# Patient Record
Sex: Female | Born: 1985 | Hispanic: No | Marital: Married | Smoking: Never smoker
Health system: Southern US, Community
[De-identification: ages and names within clinical notes are randomized; demographics above are authoritative.]

## PROBLEM LIST (undated history)

## (undated) ENCOUNTER — Inpatient Hospital Stay (HOSPITAL_COMMUNITY): Payer: Self-pay

## (undated) DIAGNOSIS — Z789 Other specified health status: Secondary | ICD-10-CM

## (undated) HISTORY — PX: TONSILLECTOMY: SUR1361

---

## 2011-09-08 ENCOUNTER — Emergency Department (HOSPITAL_COMMUNITY)
Admission: EM | Admit: 2011-09-08 | Discharge: 2011-09-08 | Disposition: A | Discharge status: Home / Self Care | Payer: PRIVATE HEALTH INSURANCE | Attending: Emergency Medicine | Admitting: Emergency Medicine

## 2011-09-08 DIAGNOSIS — R002 Palpitations: Secondary | ICD-10-CM | POA: Insufficient documentation

## 2011-09-08 DIAGNOSIS — F411 Generalized anxiety disorder: Secondary | ICD-10-CM | POA: Insufficient documentation

## 2011-09-08 DIAGNOSIS — R0789 Other chest pain: Secondary | ICD-10-CM | POA: Insufficient documentation

## 2011-09-08 LAB — D-DIMER, QUANTITATIVE: D-Dimer, Quant: 0.42 ug/mL-FEU (ref 0.00–0.48)

## 2011-09-08 LAB — BASIC METABOLIC PANEL
CO2: 25 mEq/L (ref 19–32)
Calcium: 9.6 mg/dL (ref 8.4–10.5)
GFR calc non Af Amer: >90 mL/min (ref >90)
Sodium: 138 mEq/L (ref 135–145)

## 2011-09-08 LAB — CBC
MCH: 31 pg (ref 26.0–34.0)
Platelets: 239 10*3/uL (ref 150–400)
RBC: 4.51 MIL/uL (ref 3.87–5.11)

## 2012-03-13 ENCOUNTER — Ambulatory Visit (INDEPENDENT_AMBULATORY_CARE_PROVIDER_SITE_OTHER): Payer: PRIVATE HEALTH INSURANCE | Admitting: Family Medicine

## 2012-03-13 VITALS — BP 100/65 | HR 77 | Temp 98.3°F | Resp 14 | Ht 62.0 in | Wt 94.2 lb

## 2012-03-13 DIAGNOSIS — N926 Irregular menstruation, unspecified: Secondary | ICD-10-CM

## 2012-03-13 DIAGNOSIS — M549 Dorsalgia, unspecified: Secondary | ICD-10-CM

## 2012-03-13 LAB — POCT URINE PREGNANCY: Preg Test, Ur: NEGATIVE

## 2012-03-13 MED ORDER — NAPROXEN 500 MG PO TABS: 500.0000 mg | ORAL_TABLET | Freq: Two times a day (BID) | ORAL | Status: AC

## 2012-03-13 MED ORDER — NORETHIN ACE-ETH ESTRAD-FE 1-20 MG-MCG(24) PO TABS: 1.0000 | ORAL_TABLET | Freq: Every day | ORAL | Status: DC

## 2012-03-13 NOTE — Progress Notes (Signed)
  Subjective:    Patient ID: Dana Cantu, female    DOB: 06-21-1986, 26 y.o.   MRN: [id number]  HPI 26 yo female with amenorrhea, back and abdominal pain.  Smoker. Has occurred before - has never had regular menses.  For one week has had pain of period but no period.  Woke up this morning and left leg hurt and spasmed.  Goes down leg.  Making her limp a little.  Not married, denies any sexual activity ever.  Was told once that birth control pills could regulate her periods.  Would like to try them if indicated.     Review of Systems Negative except as per HPI     Objective:   Physical Exam  Constitutional: Vital signs are normal. She appears well-developed and well-nourished. She is active.  Cardiovascular: Normal rate, regular rhythm, normal heart sounds and normal pulses.   Pulmonary/Chest: Effort normal and breath sounds normal.  Abdominal: Soft. Normal appearance and bowel sounds are normal. She exhibits no distension and no mass. There is no hepatosplenomegaly. There is no tenderness. There is no rigidity, no rebound, no guarding, no CVA tenderness, no tenderness at McBurney's point and negative Murphy's sign. No hernia.  Neurological: She is alert.     Results for orders placed in visit on 03/13/12  POCT URINE PREGNANCY      Component Value Range   Preg Test, Ur Negative          Assessment & Plan:  Irregular menses, back pain - loestrin 24 FE and naprosyn.

## 2012-04-10 ENCOUNTER — Telehealth: Payer: Self-pay

## 2012-04-10 NOTE — Telephone Encounter (Signed)
Pt prescribed birth control by [facility] and has only one pill left, she would like for Dr to prescribe something a little less expensive for her, she cant afford the rx that was given.

## 2012-04-12 MED ORDER — NORETHIN ACE-ETH ESTRAD-FE 1-20 MG-MCG PO TABS: 1.0000 | ORAL_TABLET | Freq: Every day | ORAL | Status: AC

## 2012-04-12 NOTE — Telephone Encounter (Signed)
Please advise that the product I sent in is the same medication that Dr. Georgiana Shore rd'x, but in the older form.  It is generic.

## 2012-04-13 NOTE — Telephone Encounter (Signed)
NO ANSWER, UNABLE TO LEAVE MESSAGE 

## 2012-04-14 NOTE — Telephone Encounter (Signed)
NO ANSWER, UNABLE TO LEAVE MESSAGE 

## 2012-04-15 NOTE — Telephone Encounter (Signed)
No answer/no VM. Sent unable to reach letter w/ information from [place].

## 2015-01-25 ENCOUNTER — Ambulatory Visit (INDEPENDENT_AMBULATORY_CARE_PROVIDER_SITE_OTHER): Payer: Self-pay | Admitting: Family Medicine

## 2015-01-25 VITALS — BP 100/60 | HR 105 | Temp 102.9°F | Resp 16 | Ht 63.25 in | Wt 101.4 lb

## 2015-01-25 DIAGNOSIS — J029 Acute pharyngitis, unspecified: Secondary | ICD-10-CM

## 2015-01-25 DIAGNOSIS — R05 Cough: Secondary | ICD-10-CM

## 2015-01-25 DIAGNOSIS — R059 Cough, unspecified: Secondary | ICD-10-CM

## 2015-01-25 DIAGNOSIS — R509 Fever, unspecified: Secondary | ICD-10-CM

## 2015-01-25 LAB — POCT RAPID STREP A (OFFICE): Rapid Strep A Screen: NEGATIVE

## 2015-01-25 MED ORDER — AMOXICILLIN 875 MG PO TABS: 875.0000 mg | ORAL_TABLET | Freq: Two times a day (BID) | ORAL | Status: DC

## 2015-01-25 NOTE — Patient Instructions (Signed)
Drink plenty of fluids and get enough rest  Take Tylenol 500 mg 2 pills 3 times daily as needed for fever or aching. If that does not give sufficient relief can alternate with ibuprofen 600 mg 3 times daily  Take amoxicillin 875 mg twice daily. If we notify you that the throat culture comes back negative, you can discontinue it. Do not save left over antibiotics for self treatment.

## 2015-01-25 NOTE — Progress Notes (Signed)
Subjective: Patient has been sick today with a high fever, slight cough, very sore throat, slightly runny nose, generalized body aches. She has a history of a lot of throat infections in the past and had her tonsils taken out. She did not have a flu shot this year. 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 She is currently on her menstrual cycle  Objective: Ill-appearing young lady, febrile to touch. TMs normal. Throat erythematous without exudate. No tonsils. Neck supple small nodes. Chest clear to auscultation. Heart regular without murmurs. Abdomen soft and nontender. No rashes.  Assessment: Pharyngitis Slight cough Fever  Plan: Strep test  Results for orders placed or performed in visit on 01/25/15  POCT rapid strep A  Result Value Ref Range   Rapid Strep A Screen Negative Negative   I believe this still could be strep. We'll treat with antibiotics until the culture comes back. Drink plenty of fluids as she is a little dehydrated probably. Take Tylenol and/or ibuprofen. Return at anytime if worse.

## 2015-01-28 LAB — CULTURE, GROUP A STREP: Organism ID, Bacteria: NORMAL

## 2015-12-02 NOTE — L&D Delivery Note (Signed)
30 y.o. G1P0 at 940w6d delivered a viable female infant in cephalic, ROA position. No nuchal cord. Left anterior shoulder delivered with ease. 60 sec delayed cord clamping. Cord clamped x2 and cut. Placenta delivered spontaneously intact, with 3VC. Fundus firm on exam with massage and pitocin. Good hemostasis noted.  Laceration: 2nd degree perineal Suture: 3-0 Vicryl Good hemostasis noted.  Mom and baby recovering in LDR.    Apgars:  6/8 Weight:  3500g (7#11.5oz)    Jen MowElizabeth Mariena Meares, DO [facility] Fellow [facility] 08/09/2016, 9:33 AM

## 2015-12-03 ENCOUNTER — Ambulatory Visit (INDEPENDENT_AMBULATORY_CARE_PROVIDER_SITE_OTHER): Payer: Self-pay | Admitting: Emergency Medicine

## 2015-12-03 ENCOUNTER — Encounter (HOSPITAL_COMMUNITY): Payer: Self-pay | Admitting: *Deleted

## 2015-12-03 ENCOUNTER — Inpatient Hospital Stay (HOSPITAL_COMMUNITY): Payer: Medicaid Other

## 2015-12-03 ENCOUNTER — Telehealth: Payer: Self-pay | Admitting: Gynecology

## 2015-12-03 ENCOUNTER — Inpatient Hospital Stay (HOSPITAL_COMMUNITY)
Admission: AD | Admit: 2015-12-03 | Discharge: 2015-12-03 | Disposition: A | Discharge status: Home / Self Care | Payer: Medicaid Other | Source: Ambulatory Visit | Attending: Family Medicine | Admitting: Family Medicine

## 2015-12-03 VITALS — BP 108/72 | HR 87 | Temp 98.0°F | Resp 18 | Ht 62.5 in | Wt 98.0 lb

## 2015-12-03 DIAGNOSIS — O9989 Other specified diseases and conditions complicating pregnancy, childbirth and the puerperium: Secondary | ICD-10-CM | POA: Insufficient documentation

## 2015-12-03 DIAGNOSIS — O0000 Abdominal pregnancy without intrauterine pregnancy: Secondary | ICD-10-CM

## 2015-12-03 DIAGNOSIS — Z87891 Personal history of nicotine dependence: Secondary | ICD-10-CM | POA: Diagnosis not present

## 2015-12-03 DIAGNOSIS — R1030 Lower abdominal pain, unspecified: Secondary | ICD-10-CM

## 2015-12-03 DIAGNOSIS — O3680X0 Pregnancy with inconclusive fetal viability, not applicable or unspecified: Secondary | ICD-10-CM

## 2015-12-03 DIAGNOSIS — R102 Pelvic and perineal pain: Secondary | ICD-10-CM | POA: Diagnosis not present

## 2015-12-03 DIAGNOSIS — Z3A01 Less than 8 weeks gestation of pregnancy: Secondary | ICD-10-CM | POA: Diagnosis not present

## 2015-12-03 LAB — POCT CBC
Granulocyte percent: 60.6 %G (ref 37–80)
HCT, POC: 40.3 % (ref 37.7–47.9)
HEMOGLOBIN: 14.1 g/dL (ref 12.2–16.2)
LYMPH, POC: 1.6 (ref 0.6–3.4)
MCH, POC: 30.5 pg (ref 27–31.2)
MCHC: 34.9 g/dL (ref 31.8–35.4)
MCV: 87.4 fL (ref 80–97)
MID (cbc): 0.2 (ref 0–0.9)
MPV: 7.5 fL (ref 0–99.8)
PLATELET COUNT, POC: 219 10*3/uL (ref 142–424)
POC Granulocyte: 2.7 (ref 2–6.9)
POC LYMPH %: 34.6 % (ref 10–50)
POC MID %: 4.87 %M (ref 0–12)
RBC: 4.61 M/uL (ref 4.04–5.48)
RDW, POC: 14.1 %
WBC: 4.5 10*3/uL — AB (ref 4.6–10.2)

## 2015-12-03 LAB — POCT URINALYSIS DIP (MANUAL ENTRY)
BILIRUBIN UA: NEGATIVE
Bilirubin, UA: NEGATIVE
Glucose, UA: NEGATIVE
NITRITE UA: NEGATIVE
PH UA: 7
PROTEIN UA: NEGATIVE
Spec Grav, UA: 1.015
UROBILINOGEN UA: 0.2

## 2015-12-03 LAB — HCG, QUANTITATIVE, PREGNANCY
HCG, BETA CHAIN, QUANT, S: 16715.1 m[IU]/mL — AB
hCG, Beta Chain, Quant, S: 21067 m[IU]/mL — ABNORMAL HIGH (ref <5)

## 2015-12-03 LAB — POCT WET + KOH PREP: TRICH BY WET PREP: ABSENT

## 2015-12-03 LAB — POC MICROSCOPIC URINALYSIS (UMFC): Mucus: ABSENT

## 2015-12-03 LAB — POCT URINE PREGNANCY: Preg Test, Ur: POSITIVE — AB

## 2015-12-03 MED ORDER — PRENATAL 28-0.8 MG PO TABS: 1.0000 | ORAL_TABLET | Freq: Every day | ORAL | Status: DC

## 2015-12-03 NOTE — Telephone Encounter (Signed)
Reported BHCG to Dr. Adrian BlackwaterStinson.  Has appt for repeat on Wednesday, Jan 4 in the Clinic.  Strict ectopic precautions were given to patient.

## 2015-12-03 NOTE — Patient Instructions (Signed)

## 2015-12-03 NOTE — Progress Notes (Signed)
Subjective:  Patient ID: Dana Cantu, female    DOB: [birth year]  Age: 30 y.o. MRN: [id number]  CC: Abdominal Pain and Fatigue   HPI Dana Cantu presents  patient is nulligravida who missed her last period. She did a home pregnancy test was positive. She since learning she was pregnant she been nauseated. The nausea is most prominent in the morning and evening. She has no vomiting. She now has suprapubic discomfort and pain. She denies any dysuria urgency or frequency. No stool change. She has no fever or chills. Has no vaginal bleeding or discharge.  History Dana Cantu has no past medical history on file.   She has past surgical history that includes Tonsillectomy.   Her  family history is not on file.  She   reports that she has quit smoking. Her smoking use included Cigarettes. She does not have any smokeless tobacco history on file. She reports that she does not drink alcohol or use illicit drugs.  Outpatient Prescriptions Prior to Visit  Medication Sig Dispense Refill  . amoxicillin (AMOXIL) 875 MG tablet Take 1 tablet (875 mg total) by mouth 2 (two) times daily. (Patient not taking: Reported on 12/03/2015) 20 tablet 0   No facility-administered medications prior to visit.    Social History   Social History  . Marital Status: Single    Spouse Name: N/A  . Number of Children: N/A  . Years of Education: N/A   Social History Main Topics  . Smoking status: Former Smoker    Types: Cigarettes  . Smokeless tobacco: None  . Alcohol Use: No  . Drug Use: No  . Sexual Activity: Not Asked   Other Topics Concern  . None   Social History Narrative     Review of Systems  Constitutional: Negative for fever, chills and appetite change.  HENT: Negative for congestion, ear pain, postnasal drip, sinus pressure and sore throat.   Eyes: Negative for pain and redness.  Respiratory: Negative for cough, shortness of breath and wheezing.   Cardiovascular: Negative for leg swelling.    Gastrointestinal: Positive for nausea, vomiting and abdominal pain. Negative for diarrhea, constipation and blood in stool.  Endocrine: Negative for polyuria.  Genitourinary: Positive for pelvic pain. Negative for dysuria, urgency, frequency and flank pain.  Musculoskeletal: Negative for gait problem.  Skin: Negative for rash.  Neurological: Negative for weakness and headaches.  Psychiatric/Behavioral: Negative for confusion and decreased concentration. The patient is not nervous/anxious.     Objective:  BP 108/72 mmHg  Pulse 87  Temp(Src) 98 F (36.7 C) (Oral)  Resp 18  Ht 5' 2.5" (1.588 m)  Wt 98 lb (44.453 kg)  BMI 17.63 kg/m2  SpO2 98%  LMP 10/27/2015  Physical Exam  Constitutional: She is oriented to person, place, and time. She appears well-developed and well-nourished. No distress.  HENT:  Head: Normocephalic and atraumatic.  Right Ear: External ear normal.  Left Ear: External ear normal.  Nose: Nose normal.  Eyes: Conjunctivae and EOM are normal. Pupils are equal, round, and reactive to light. No scleral icterus.  Neck: Normal range of motion. Neck supple. No tracheal deviation present.  Cardiovascular: Normal rate, regular rhythm and normal heart sounds.   Pulmonary/Chest: Effort normal. No respiratory distress. She has no wheezes. She has no rales.  Abdominal: She exhibits no mass. There is tenderness in the suprapubic area. There is no rebound and no guarding.  Musculoskeletal: She exhibits no edema.  Lymphadenopathy:    She has no  cervical adenopathy.  Neurological: She is alert and oriented to person, place, and time. Coordination normal.  Skin: Skin is warm and dry. No rash noted.  Psychiatric: She has a normal mood and affect. Her behavior is normal.      Assessment & Plan:   Grant was seen today for abdominal pain and fatigue.  Diagnoses and all orders for this visit:  Lower abdominal pain -     POCT CBC -     POCT urinalysis dipstick -     POCT  Microscopic Urinalysis ([facility]) -     POCT urine pregnancy -     hCG, quantitative, pregnancy -     US Pelvis Complete; Future -     POCT Wet + KOH Prep  Abdominal pregnancy, unspecified whether intrauterine pregnancy present -     US Pelvis Complete; Future -     POCT Wet + KOH Prep -     Ambulatory referral to Obstetrics / Gynecology  Other orders -     PRENATAL 28-0.8 MG TABS; Take 1 tablet by mouth daily.   I am having Dana Cantu start on PRENATAL. I am also having her maintain her amoxicillin.  Meds ordered this encounter  Medications  . PRENATAL 28-0.8 MG TABS    Sig: Take 1 tablet by mouth daily.    Dispense:  30 each    Refill:  12   She was referred to the women's ER as we could not obtain a vaginal ultrasound today due to the holiday.  Appropriate red flag conditions were discussed with the patient as well as actions that should be taken.  Patient expressed his understanding.  Follow-up: Return if symptoms worsen or fail to improve.  Carmelina Dane, MD  Results for orders placed or performed in visit on 12/03/15  POCT CBC  Result Value Ref Range   WBC 4.5 (A) 4.6 - 10.2 K/uL   Lymph, poc 1.6 0.6 - 3.4   POC LYMPH PERCENT 34.6 10 - 50 %L   MID (cbc) 0.2 0 - 0.9   POC MID % 4.87 0 - 12 %M   POC Granulocyte 2.7 2 - 6.9   Granulocyte percent 60.6 37 - 80 %G   RBC 4.61 4.04 - 5.48 M/uL   Hemoglobin 14.1 12.2 - 16.2 g/dL   HCT, POC 16.1 09.6 - 47.9 %   MCV 87.4 80 - 97 fL   MCH, POC 30.5 27 - 31.2 pg   MCHC 34.9 31.8 - 35.4 g/dL   RDW, POC 04.5 %   Platelet Count, POC 219 142 - 424 K/uL   MPV 7.5 0 - 99.8 fL  POCT urinalysis dipstick  Result Value Ref Range   Color, UA yellow yellow   Clarity, UA cloudy (A) clear   Glucose, UA negative negative   Bilirubin, UA negative negative   Ketones, POC UA negative negative   Spec Grav, UA 1.015    Blood, UA trace-intact (A) negative   pH, UA 7.0    Protein Ur, POC negative negative   Urobilinogen, UA 0.2     Nitrite, UA Negative Negative   Leukocytes, UA moderate (2+) (A) Negative  POCT Microscopic Urinalysis ([facility])  Result Value Ref Range   WBC,UR,HPF,POC Few (A) None WBC/hpf   RBC,UR,HPF,POC None None RBC/hpf   Bacteria Few (A) None, Too numerous to count   Mucus Absent Absent   Epithelial Cells, UR Per Microscopy Few (A) None, Too numerous to count cells/hpf  POCT urine  pregnancy  Result Value Ref Range   Preg Test, Ur Positive (A) Negative  POCT Wet + KOH Prep  Result Value Ref Range   Yeast by KOH Present Present, Absent   Yeast by wet prep Present Present, Absent   WBC by wet prep Moderate (A) None, Few, Too numerous to count   Clue Cells Wet Prep HPF POC Few (A) None, Too numerous to count   Trich by wet prep Absent Present, Absent   Bacteria Wet Prep HPF POC Moderate (A) None, Few, Too numerous to count   Epithelial Cells By [employer] Pref ([facility]) Many (A) None, Few, Too numerous to count   RBC,UR,HPF,POC None None RBC/hpf

## 2015-12-03 NOTE — Progress Notes (Signed)
GYN Exam: Normal female external genitalia. Pubic hair is shaved. No inguinal or femoral lymphadenopathy. Moderate to large thick white discharge in the vaginal vault. Moderate amount of brown discharge in the vault. Cervix with closed os. No CMT. No RIGHT adnexal tenderness or fullness. Moderate LEFT adnexal tenderness. No fullness.

## 2015-12-03 NOTE — Discharge Instructions (Signed)

## 2015-12-03 NOTE — MAU Provider Note (Signed)
History     CSN: [id number]  Arrival date and time: 12/03/15 1243   None     Chief Complaint  Patient presents with  . Abdominal Pain   HPI Dana Cantu is 30 y.o. G1P0 [redacted]w[redacted]d weeks presenting after evaluation at Urgent Care for pelvic pain in early pregnancy.  Labs were drawn there.  Here per patient to rule out pregnancy in the wrong place.  She is asking no other labs be drawn unless necessary to reduce her cost.  Explained labs usually done in Ectopic workup--CBC and BHCG drawn at Urgent Care.  She is not having any vaginal bleeding, ABO RH discontinued.  She understands she will need repeat BHCG in 48 hrs.     No past medical history on file.  Past Surgical History  Procedure Laterality Date  . Tonsillectomy      No family history on file.  Social History  Substance Use Topics  . Smoking status: Former Smoker    Types: Cigarettes  . Smokeless tobacco: Not on file  . Alcohol Use: No    Allergies: No Known Allergies  Prescriptions prior to admission  Medication Sig Dispense Refill Last Dose  . amoxicillin (AMOXIL) 875 MG tablet Take 1 tablet (875 mg total) by mouth 2 (two) times daily. (Patient not taking: Reported on 12/03/2015) 20 tablet 0 Not Taking  . PRENATAL 28-0.8 MG TABS Take 1 tablet by mouth daily. 30 each 12     Review of Systems  Constitutional: Negative for fever and chills.  Gastrointestinal: Positive for abdominal pain (lower pelvic pain). Negative for nausea and vomiting.  Genitourinary: Negative for dysuria, urgency and frequency.       Neg for vaginal bleeding.  Neurological: Negative for headaches.   Physical Exam   Blood pressure 104/66, pulse 79, temperature 97.9 F (36.6 C), temperature source Oral, resp. rate 16, height 5' 2.5" (1.588 m), last menstrual period 10/27/2015.  Physical Exam  Constitutional: She is oriented to person, place, and time. She appears well-developed and well-nourished. No distress.  HENT:  Head: Normocephalic.   Cardiovascular: Normal rate.   Respiratory: Effort normal.  Genitourinary:  Pelvic not repeated.  Done at Urgent Care earlier today.  Neg for increasing sxs.   Neurological: She is alert and oriented to person, place, and time.  Skin: Skin is warm and dry.  Psychiatric: She has a normal mood and affect. Her behavior is normal.   LAB RESULTS FROM URGENT CARE:  Results for orders placed or performed in visit on 12/03/15 (from the past 24 hour(s))  hCG, quantitative, pregnancy     Status: Abnormal   Collection Time: 12/03/15 10:55 AM  Result Value Ref Range   hCG, Beta Chain, Quant, S 16715.1 (H) mIU/mL   Narrative   Performed at:  [facility]                [address]                [place], [place] [address]  POCT CBC     Status: Abnormal   Collection Time: 12/03/15 11:06 AM  Result Value Ref Range   WBC 4.5 (A) 4.6 - 10.2 K/uL   Lymph, poc 1.6 0.6 - 3.4   POC LYMPH PERCENT 34.6 10 - 50 %L   MID (cbc) 0.2 0 - 0.9   POC MID % 4.87 0 - 12 %M   POC Granulocyte 2.7 2 - 6.9   Granulocyte percent 60.6 37 -  80 %G   RBC 4.61 4.04 - 5.48 M/uL   Hemoglobin 14.1 12.2 - 16.2 g/dL   HCT, POC 16.1 09.6 - 47.9 %   MCV 87.4 80 - 97 fL   MCH, POC 30.5 27 - 31.2 pg   MCHC 34.9 31.8 - 35.4 g/dL   RDW, POC 04.5 %   Platelet Count, POC 219 142 - 424 K/uL   MPV 7.5 0 - 99.8 fL  POCT urinalysis dipstick     Status: Abnormal   Collection Time: 12/03/15 11:06 AM  Result Value Ref Range   Color, UA yellow yellow   Clarity, UA cloudy (A) clear   Glucose, UA negative negative   Bilirubin, UA negative negative   Ketones, POC UA negative negative   Spec Grav, UA 1.015    Blood, UA trace-intact (A) negative   pH, UA 7.0    Protein Ur, POC negative negative   Urobilinogen, UA 0.2    Nitrite, UA Negative Negative   Leukocytes, UA moderate (2+) (A) Negative  POCT Microscopic Urinalysis ([facility])     Status: Abnormal   Collection Time: 12/03/15 11:14 AM  Result Value Ref Range    WBC,UR,HPF,POC Few (A) None WBC/hpf   RBC,UR,HPF,POC None None RBC/hpf   Bacteria Few (A) None, Too numerous to count   Mucus Absent Absent   Epithelial Cells, UR Per Microscopy Few (A) None, Too numerous to count cells/hpf  POCT urine pregnancy     Status: Abnormal   Collection Time: 12/03/15 11:14 AM  Result Value Ref Range   Preg Test, Ur Positive (A) Negative  POCT Wet + KOH Prep     Status: Abnormal   Collection Time: 12/03/15  1:15 PM  Result Value Ref Range   Yeast by KOH Present Present, Absent   Yeast by wet prep Present Present, Absent   WBC by wet prep Moderate (A) None, Few, Too numerous to count   Clue Cells Wet Prep HPF POC Few (A) None, Too numerous to count   Trich by wet prep Absent Present, Absent   Bacteria Wet Prep HPF POC Moderate (A) None, Few, Too numerous to count   Epithelial Cells By [employer] ([facility]) Many (A) None, Few, Too numerous to count   RBC,UR,HPF,POC None None RBC/hpf    ULTRASOUND DONE AT [facility]  US Ob Comp Less 14 Wks  12/03/2015  CLINICAL DATA:  Patient with pelvic pain.  Positive pregnancy test. EXAM: OBSTETRIC <14 WK [place] AND TRANSVAGINAL OB US TECHNIQUE: Both transabdominal and transvaginal ultrasound examinations were performed for complete evaluation of the gestation as well as the maternal uterus, adnexal regions, and pelvic cul-de-sac. Transvaginal technique was performed to assess early pregnancy. COMPARISON:  None. FINDINGS: Intrauterine gestational sac: Visualized/normal in shape. Yolk sac:  Not present Embryo:  Not present Cardiac Activity: Not present MSD: 14  mm   6 w   2  d Subchorionic hemorrhage:  None visualized. Maternal uterus/adnexae: Corpus luteum within the left ovary. Normal right ovary. Small amount of free fluid in the pelvis. IMPRESSION: Probable early intrauterine gestational sac, but no yolk sac, fetal pole, or cardiac activity yet visualized. Recommend follow-up quantitative B-HCG levels and follow-up US in 14 days to confirm  and assess viability. This recommendation follows SRU consensus guidelines: Diagnostic Criteria for Nonviable Pregnancy Early in the First Trimester. Malva Limes Med 2013; 409:8119-14. Electronically Signed   By: Annia Belt M.D.   On: 12/03/2015 16:12   US Ob Transvaginal  12/03/2015  CLINICAL DATA:  Patient with pelvic pain.  Positive pregnancy test. EXAM: OBSTETRIC <14 WK US AND TRANSVAGINAL OB US TECHNIQUE: Both transabdominal and transvaginal ultrasound examinations were performed for complete evaluation of the gestation as well as the maternal uterus, adnexal regions, and pelvic cul-de-sac. Transvaginal technique was performed to assess early pregnancy. COMPARISON:  None. FINDINGS: Intrauterine gestational sac: Visualized/normal in shape. Yolk sac:  Not present Embryo:  Not present Cardiac Activity: Not present MSD: 14  mm   6 w   2  d Subchorionic hemorrhage:  None visualized. Maternal uterus/adnexae: Corpus luteum within the left ovary. Normal right ovary. Small amount of free fluid in the pelvis. IMPRESSION: Probable early intrauterine gestational sac, but no yolk sac, fetal pole, or cardiac activity yet visualized. Recommend follow-up quantitative B-HCG levels and follow-up US in 14 days to confirm and assess viability. This recommendation follows SRU consensus guidelines: Diagnostic Criteria for Nonviable Pregnancy Early in the First Trimester. Malva Limes Engl J Med 2013; 409:8119-14; 369:1443-51. Electronically Signed   By: Annia Beltrew  Davis M.D.   On: 12/03/2015 16:12   MAU Course  Procedures   Repeat BHCG drawn   MDM MSE U/S Discussed lab findings and US with Dr. Adrian BlackwaterStinson.  With BHCG results at another facility being so high without correlating U/S finding, Dr. Adrian BlackwaterStinson requested repeat BHCG and repeat in 48 hrs.   Assessment and Plan  A:  Pelvic pain in early pregnancy      U/S shows 5721w2d Probable early IUGS without YS, FP or CA  P: Discussed strict ectopic precautions, return for increased pain or vaginal bleeding     Return in 48 hrs for repeat BHCG in [facility]--appt requested in Yellow book.  KEY,EVE M 12/03/2015, 4:30 PM

## 2015-12-03 NOTE — MAU Note (Signed)
Pt seen @ UC today, pos UPT, abd pain - sent to MAU.  Lower abd pain for the last 3 weeks, intermittent.  Denies bleeding.

## 2015-12-04 ENCOUNTER — Telehealth: Payer: Self-pay

## 2015-12-04 NOTE — Telephone Encounter (Signed)
Per Dr. Dareen PianoAnderson she does not have an infection and she needs to follow up with gyn tomorrow.  Advised her to let them know about the pain she is having.

## 2015-12-04 NOTE — Telephone Encounter (Signed)
Pt states Dr Dareen PianoAnderson was suppose to give her an antibiotic of AMOXIL but he didn't. Please call [phone]     [facility] [address]

## 2015-12-05 ENCOUNTER — Encounter: Payer: Self-pay | Admitting: Obstetrics and Gynecology

## 2015-12-05 ENCOUNTER — Ambulatory Visit (INDEPENDENT_AMBULATORY_CARE_PROVIDER_SITE_OTHER): Payer: Self-pay | Admitting: Obstetrics and Gynecology

## 2015-12-05 DIAGNOSIS — O3680X0 Pregnancy with inconclusive fetal viability, not applicable or unspecified: Secondary | ICD-10-CM

## 2015-12-05 DIAGNOSIS — Z331 Pregnant state, incidental: Secondary | ICD-10-CM

## 2015-12-05 LAB — HCG, QUANTITATIVE, PREGNANCY: hCG, Beta Chain, Quant, S: 31908 m[IU]/mL — ABNORMAL HIGH (ref <5)

## 2015-12-05 NOTE — Progress Notes (Signed)
F/u OB US scheduled for January 13th @ 0900.  Pt notified.

## 2015-12-05 NOTE — Progress Notes (Signed)
Patient ID: Dana Cantu, female   DOB: 27-Sep-1986, 30 y.o.   MRN: [id number] Lab Results Note  SUBJECTIVE HPI:  Dana Cantu is a 30 y.o. G1P0 at [redacted]w[redacted]d by LMP who presents to the [facility] for followupquant HCG. The patient denies abdominal pain or vaginal bleeding.  Upon review of the patient's records, patient was first seen in [facility] on 12/03/2015 for pelvic pain in early pregnacy.   BHCG on that day was 78295.  Ultrasound showed an intrauterine gestational sac but no fetal pole or yolk sac.  Repeat quant HCG was performed earlier today.   History reviewed. No pertinent past medical history. Past Surgical History  Procedure Laterality Date  . Tonsillectomy     Social History   Social History  . Marital Status: Married    Spouse Name: N/A  . Number of Children: N/A  . Years of Education: N/A   Occupational History  . Not on file.   Social History Main Topics  . Smoking status: Former Smoker    Types: Cigarettes  . Smokeless tobacco: Not on file  . Alcohol Use: No  . Drug Use: No  . Sexual Activity: Not on file   Other Topics Concern  . Not on file   Social History Narrative   Current Outpatient Prescriptions on File Prior to Visit  Medication Sig Dispense Refill  . PRENATAL 28-0.8 MG TABS Take 1 tablet by mouth daily. 30 each 12   No current facility-administered medications on file prior to visit.   No Known Allergies  I have reviewed patient's Past Medical Hx, Surgical Hx, Family Hx, Social Hx, medications and allergies.   Review of Systems Review of Systems  Constitutional: Negative for fever and chills.  Gastrointestinal: Negative for nausea, vomiting, abdominal pain, diarrhea and constipation.  Genitourinary: Negative for dysuria.  Musculoskeletal: Negative for back pain.  Neurological: Negative for dizziness and weakness.    Physical Exam  LMP 10/27/2015  GENERAL: Well-developed, well-nourished female in no acute distress.  HEENT:  Normocephalic, atraumatic.   LUNGS: Effort normal ABDOMEN: soft, non-tender HEART: Regular rate  SKIN: Warm, dry and without erythema PSYCH: Normal mood and affect NEURO: Alert and oriented x 4  LAB RESULTS No results found for this or any previous visit (from the past 24 hour(s)).  IMAGING US Ob Comp Less 14 Wks  12/03/2015  CLINICAL DATA:  Patient with pelvic pain.  Positive pregnancy test. EXAM: OBSTETRIC <14 WK [place] AND TRANSVAGINAL OB US TECHNIQUE: Both transabdominal and transvaginal ultrasound examinations were performed for complete evaluation of the gestation as well as the maternal uterus, adnexal regions, and pelvic cul-de-sac. Transvaginal technique was performed to assess early pregnancy. COMPARISON:  None. FINDINGS: Intrauterine gestational sac: Visualized/normal in shape. Yolk sac:  Not present Embryo:  Not present Cardiac Activity: Not present MSD: 14  mm   6 w   2  d Subchorionic hemorrhage:  None visualized. Maternal uterus/adnexae: Corpus luteum within the left ovary. Normal right ovary. Small amount of free fluid in the pelvis. IMPRESSION: Probable early intrauterine gestational sac, but no yolk sac, fetal pole, or cardiac activity yet visualized. Recommend follow-up quantitative B-HCG levels and follow-up US in 14 days to confirm and assess viability. This recommendation follows SRU consensus guidelines: Diagnostic Criteria for Nonviable Pregnancy Early in the First Trimester. Malva Limes Med 2013; 621:3086-57. Electronically Signed   By: Annia Belt M.D.   On: 12/03/2015 16:12   US Ob Transvaginal  12/03/2015  CLINICAL  DATA:  Patient with pelvic pain.  Positive pregnancy test. EXAM: OBSTETRIC <14 WK US AND TRANSVAGINAL OB US TECHNIQUE: Both transabdominal and transvaginal ultrasound examinations were performed for complete evaluation of the gestation as well as the maternal uterus, adnexal regions, and pelvic cul-de-sac. Transvaginal technique was performed to assess early pregnancy.  COMPARISON:  None. FINDINGS: Intrauterine gestational sac: Visualized/normal in shape. Yolk sac:  Not present Embryo:  Not present Cardiac Activity: Not present MSD: 14  mm   6 w   2  d Subchorionic hemorrhage:  None visualized. Maternal uterus/adnexae: Corpus luteum within the left ovary. Normal right ovary. Small amount of free fluid in the pelvis. IMPRESSION: Probable early intrauterine gestational sac, but no yolk sac, fetal pole, or cardiac activity yet visualized. Recommend follow-up quantitative B-HCG levels and follow-up US in 14 days to confirm and assess viability. This recommendation follows SRU consensus guidelines: Diagnostic Criteria for Nonviable Pregnancy Early in the First Trimester. Malva Limes Engl J Med 2013; 308:6578-46; 369:1443-51. Electronically Signed   By: Annia Beltrew  Davis M.D.   On: 12/03/2015 16:12    ASSESSMENT 1. Pregnancy of unknown anatomic location     PLAN Discharge home in stable condition. Ectopic precautions reviewed Patient advised to start/continue taking prenatal vitamins Quant HCG [id number] today (rise from 21068 2 days ago) Will schedule viability ultrasound in week of 12/17/2015 [facility] to [facility]  Dana AntiguaPeggy Netanel Yannuzzi, MD  12/05/2015  3:03 PM

## 2015-12-14 ENCOUNTER — Ambulatory Visit (HOSPITAL_COMMUNITY)
Admission: RE | Admit: 2015-12-14 | Discharge: 2015-12-14 | Disposition: A | Discharge status: Home / Self Care | Payer: Medicaid Other | Source: Ambulatory Visit | Attending: Obstetrics and Gynecology | Admitting: Obstetrics and Gynecology

## 2015-12-14 ENCOUNTER — Ambulatory Visit (INDEPENDENT_AMBULATORY_CARE_PROVIDER_SITE_OTHER): Payer: Self-pay | Admitting: Obstetrics and Gynecology

## 2015-12-14 ENCOUNTER — Encounter: Payer: Self-pay | Admitting: Obstetrics and Gynecology

## 2015-12-14 DIAGNOSIS — Z3490 Encounter for supervision of normal pregnancy, unspecified, unspecified trimester: Secondary | ICD-10-CM

## 2015-12-14 DIAGNOSIS — Z36 Encounter for antenatal screening of mother: Secondary | ICD-10-CM | POA: Diagnosis present

## 2015-12-14 DIAGNOSIS — O3680X0 Pregnancy with inconclusive fetal viability, not applicable or unspecified: Secondary | ICD-10-CM

## 2015-12-14 DIAGNOSIS — Z3201 Encounter for pregnancy test, result positive: Secondary | ICD-10-CM

## 2015-12-14 NOTE — Progress Notes (Signed)
Patient ID: Dana Cantu, female   DOB: 22-Feb-1986, 30 y.o.   MRN: [id number] Ultrasounds Results Note  SUBJECTIVE HPI:  Dana Cantu is a 29 y.o. G1P0 at [redacted]w[redacted]d by LMP who presents to the [facility] for followup ultrasound results. The patient denies abdominal pain or vaginal bleeding.  Patient had an ultrasound on 12/03/2015 showing a IUGS without fetal pole. Her quant rose to 30000 on 1/4. Patient had her follow-up ultrasound today  History reviewed. No pertinent past medical history. Past Surgical History  Procedure Laterality Date  . Tonsillectomy     Social History   Social History  . Marital Status: Married    Spouse Name: N/A  . Number of Children: N/A  . Years of Education: N/A   Occupational History  . Not on file.   Social History Main Topics  . Smoking status: Former Smoker    Types: Cigarettes  . Smokeless tobacco: Not on file  . Alcohol Use: No  . Drug Use: No  . Sexual Activity: Not on file   Other Topics Concern  . Not on file   Social History Narrative   Current Outpatient Prescriptions on File Prior to Visit  Medication Sig Dispense Refill  . PRENATAL 28-0.8 MG TABS Take 1 tablet by mouth daily. 30 each 12   No current facility-administered medications on file prior to visit.   No Known Allergies  I have reviewed patient's Past Medical Hx, Surgical Hx, Family Hx, Social Hx, medications and allergies.   Review of Systems Review of Systems  Constitutional: Negative for fever and chills.  Gastrointestinal: Negative for nausea, vomiting, abdominal pain, diarrhea and constipation.  Genitourinary: Negative for dysuria.  Musculoskeletal: Negative for back pain.  Neurological: Negative for dizziness and weakness.    Physical Exam  LMP 10/27/2015  GENERAL: Well-developed, well-nourished female in no acute distress.  HEENT: Normocephalic, atraumatic.   LUNGS: Effort normal ABDOMEN: soft, non-tender HEART: Regular rate  SKIN: Warm, dry  and without erythema PSYCH: Normal mood and affect NEURO: Alert and oriented x 4  LAB RESULTS No results found for this or any previous visit (from the past 24 hour(s)).  IMAGING US Ob Comp Less 14 Wks  12/03/2015  CLINICAL DATA:  Patient with pelvic pain.  Positive pregnancy test. EXAM: OBSTETRIC <14 WK [place] AND TRANSVAGINAL OB US TECHNIQUE: Both transabdominal and transvaginal ultrasound examinations were performed for complete evaluation of the gestation as well as the maternal uterus, adnexal regions, and pelvic cul-de-sac. Transvaginal technique was performed to assess early pregnancy. COMPARISON:  None. FINDINGS: Intrauterine gestational sac: Visualized/normal in shape. Yolk sac:  Not present Embryo:  Not present Cardiac Activity: Not present MSD: 14  mm   6 w   2  d Subchorionic hemorrhage:  None visualized. Maternal uterus/adnexae: Corpus luteum within the left ovary. Normal right ovary. Small amount of free fluid in the pelvis. IMPRESSION: Probable early intrauterine gestational sac, but no yolk sac, fetal pole, or cardiac activity yet visualized. Recommend follow-up quantitative B-HCG levels and follow-up US in 14 days to confirm and assess viability. This recommendation follows SRU consensus guidelines: Diagnostic Criteria for Nonviable Pregnancy Early in the First Trimester. Malva Limes Med 2013; 782:9562-13. Electronically Signed   By: Annia Belt M.D.   On: 12/03/2015 16:12   US Ob Transvaginal  12/14/2015  CLINICAL DATA:  Followup viability. LMP 10/27/2015. By LMP patient is 6 weeks 6 days. EDC by LMP is 08/02/2016. EXAM: TRANSVAGINAL OB ULTRASOUND TECHNIQUE: Transvaginal  ultrasound was performed for complete evaluation of the gestation as well as the maternal uterus, adnexal regions, and pelvic cul-de-sac. COMPARISON:  12/03/2015 FINDINGS: Intrauterine gestational sac: Visualized/normal in shape. Yolk sac:  Present Embryo:  Present Cardiac Activity: Present Heart Rate: Present bpm CRL:   8.1   mm   6 w 5 d                  US EDC: 08/03/2016 Subchorionic hemorrhage:  None visualized. Maternal uterus/adnexae: Ovaries have a normal appearance. Left corpus luteum cyst is present. Trace free pelvic fluid is noted. IMPRESSION: 1. Single living intrauterine embryo. 2. Size and dates correlate well. Today's exam confirms clinical EDC of 08/02/2016. Electronically Signed   By: Norva PavlovElizabeth  Brown M.D.   On: 12/14/2015 09:56   [place] Ob Transvaginal  12/03/2015  CLINICAL DATA:  Patient with pelvic pain.  Positive pregnancy test. EXAM: OBSTETRIC <14 WK US AND TRANSVAGINAL OB US TECHNIQUE: Both transabdominal and transvaginal ultrasound examinations were performed for complete evaluation of the gestation as well as the maternal uterus, adnexal regions, and pelvic cul-de-sac. Transvaginal technique was performed to assess early pregnancy. COMPARISON:  None. FINDINGS: Intrauterine gestational sac: Visualized/normal in shape. Yolk sac:  Not present Embryo:  Not present Cardiac Activity: Not present MSD: 14  mm   6 w   2  d Subchorionic hemorrhage:  None visualized. Maternal uterus/adnexae: Corpus luteum within the left ovary. Normal right ovary. Small amount of free fluid in the pelvis. IMPRESSION: Probable early intrauterine gestational sac, but no yolk sac, fetal pole, or cardiac activity yet visualized. Recommend follow-up quantitative B-HCG levels and follow-up US in 14 days to confirm and assess viability. This recommendation follows SRU consensus guidelines: Diagnostic Criteria for Nonviable Pregnancy Early in the First Trimester. Malva Limes Engl J Med 2013; 161:0960-45; 369:1443-51. Electronically Signed   By: Annia Beltrew  Davis M.D.   On: 12/03/2015 16:12    ASSESSMENT 1. Pregnancy     PLAN Discharge home in stable condition Patient advised to start taking prenatal vitamins Pregnancy confirmation letter given Patient [facility]  Catalina AntiguaPeggy Josselin Gaulin, MD  12/14/2015   10:00 AM

## 2016-01-09 ENCOUNTER — Ambulatory Visit (INDEPENDENT_AMBULATORY_CARE_PROVIDER_SITE_OTHER): Payer: Self-pay | Admitting: Family

## 2016-01-09 ENCOUNTER — Encounter: Payer: Self-pay | Admitting: Family

## 2016-01-09 VITALS — BP 116/79 | HR 90 | Temp 98.3°F | Wt 100.5 lb

## 2016-01-09 DIAGNOSIS — Z3481 Encounter for supervision of other normal pregnancy, first trimester: Secondary | ICD-10-CM

## 2016-01-09 DIAGNOSIS — Z124 Encounter for screening for malignant neoplasm of cervix: Secondary | ICD-10-CM | POA: Diagnosis not present

## 2016-01-09 DIAGNOSIS — N898 Other specified noninflammatory disorders of vagina: Secondary | ICD-10-CM

## 2016-01-09 DIAGNOSIS — O26891 Other specified pregnancy related conditions, first trimester: Secondary | ICD-10-CM

## 2016-01-09 DIAGNOSIS — Z3490 Encounter for supervision of normal pregnancy, unspecified, unspecified trimester: Secondary | ICD-10-CM | POA: Insufficient documentation

## 2016-01-09 LAB — POCT URINALYSIS DIP (DEVICE)
BILIRUBIN URINE: NEGATIVE
Glucose, UA: NEGATIVE mg/dL
KETONES UR: NEGATIVE mg/dL
NITRITE: NEGATIVE
PH: 7 (ref 5.0–8.0)
PROTEIN: NEGATIVE mg/dL
Specific Gravity, Urine: 1.02 (ref 1.005–1.030)
Urobilinogen, UA: 0.2 mg/dL (ref 0.0–1.0)

## 2016-01-09 MED ORDER — TERCONAZOLE 0.4 % VA CREA: 1.0000 | TOPICAL_CREAM | Freq: Every day | VAGINAL | Status: DC

## 2016-01-09 NOTE — Patient Instructions (Signed)
First Trimester of Pregnancy The first trimester of pregnancy is from week 1 until the end of week 12 (months 1 through 3). A week after a sperm fertilizes an egg, the egg will implant on the wall of the uterus. This embryo will begin to develop into a baby. Genes from you and your partner are forming the baby. The female genes determine whether the baby is a boy or a girl. At 6-8 weeks, the eyes and face are formed, and the heartbeat can be seen on ultrasound. At the end of 12 weeks, all the baby's organs are formed.  Now that you are pregnant, you will want to do everything you can to have a healthy baby. Two of the most important things are to get good prenatal care and to follow your health care provider's instructions. Prenatal care is all the medical care you receive before the baby's birth. This care will help prevent, find, and treat any problems during the pregnancy and childbirth. BODY CHANGES Your body goes through many changes during pregnancy. The changes vary from woman to woman.   You may gain or lose a couple of pounds at first.  You may feel sick to your stomach (nauseous) and throw up (vomit). If the vomiting is uncontrollable, call your health care provider.  You may tire easily.  You may develop headaches that can be relieved by medicines approved by your health care provider.  You may urinate more often. Painful urination may mean you have a bladder infection.  You may develop heartburn as a result of your pregnancy.  You may develop constipation because certain hormones are causing the muscles that push waste through your intestines to slow down.  You may develop hemorrhoids or swollen, bulging veins (varicose veins).  Your breasts may begin to grow larger and become tender. Your nipples may stick out more, and the tissue that surrounds them (areola) may become darker.  Your gums may bleed and may be sensitive to brushing and flossing.  Dark spots or blotches (chloasma,  mask of pregnancy) may develop on your face. This will likely fade after the baby is born.  Your menstrual periods will stop.  You may have a loss of appetite.  You may develop cravings for certain kinds of food.  You may have changes in your emotions from day to day, such as being excited to be pregnant or being concerned that something may go wrong with the pregnancy and baby.  You may have more vivid and strange dreams.  You may have changes in your hair. These can include thickening of your hair, rapid growth, and changes in texture. Some women also have hair loss during or after pregnancy, or hair that feels dry or thin. Your hair will most likely return to normal after your baby is born. WHAT TO EXPECT AT YOUR PRENATAL VISITS During a routine prenatal visit:  You will be weighed to make sure you and the baby are growing normally.  Your blood pressure will be taken.  Your abdomen will be measured to track your baby's growth.  The fetal heartbeat will be listened to starting around week 10 or 12 of your pregnancy.  Test results from any previous visits will be discussed. Your health care provider may ask you:  How you are feeling.  If you are feeling the baby move.  If you have had any abnormal symptoms, such as leaking fluid, bleeding, severe headaches, or abdominal cramping.  If you are using any tobacco products,   including cigarettes, chewing tobacco, and electronic cigarettes.  If you have any questions. Other tests that may be performed during your first trimester include:  Blood tests to find your blood type and to check for the presence of any previous infections. They will also be used to check for low iron levels (anemia) and Rh antibodies. Later in the pregnancy, blood tests for diabetes will be done along with other tests if problems develop.  Urine tests to check for infections, diabetes, or protein in the urine.  An ultrasound to confirm the proper growth  and development of the baby.  An amniocentesis to check for possible genetic problems.  Fetal screens for spina bifida and Down syndrome.  You may need other tests to make sure you and the baby are doing well.  HIV (human immunodeficiency virus) testing. Routine prenatal testing includes screening for HIV, unless you choose not to have this test. HOME CARE INSTRUCTIONS  Medicines  Follow your health care provider's instructions regarding medicine use. Specific medicines may be either safe or unsafe to take during pregnancy.  Take your prenatal vitamins as directed.  If you develop constipation, try taking a stool softener if your health care provider approves. Diet  Eat regular, well-balanced meals. Choose a variety of foods, such as meat or vegetable-based protein, fish, milk and low-fat dairy products, vegetables, fruits, and whole grain breads and cereals. Your health care provider will help you determine the amount of weight gain that is right for you.  Avoid raw meat and uncooked cheese. These carry germs that can cause birth defects in the baby.  Eating four or five small meals rather than three large meals a day may help relieve nausea and vomiting. If you start to feel nauseous, eating a few soda crackers can be helpful. Drinking liquids between meals instead of during meals also seems to help nausea and vomiting.  If you develop constipation, eat more high-fiber foods, such as fresh vegetables or fruit and whole grains. Drink enough fluids to keep your urine clear or pale yellow. Activity and Exercise  Exercise only as directed by your health care provider. Exercising will help you:  Control your weight.  Stay in shape.  Be prepared for labor and delivery.  Experiencing pain or cramping in the lower abdomen or low back is a good sign that you should stop exercising. Check with your health care provider before continuing normal exercises.  Try to avoid standing for long  periods of time. Move your legs often if you must stand in one place for a long time.  Avoid heavy lifting.  Wear low-heeled shoes, and practice good posture.  You may continue to have sex unless your health care provider directs you otherwise. Relief of Pain or Discomfort  Wear a good support bra for breast tenderness.   Take warm sitz baths to soothe any pain or discomfort caused by hemorrhoids. Use hemorrhoid cream if your health care provider approves.   Rest with your legs elevated if you have leg cramps or low back pain.  If you develop varicose veins in your legs, wear support hose. Elevate your feet for 15 minutes, 3-4 times a day. Limit salt in your diet. Prenatal Care  Schedule your prenatal visits by the twelfth week of pregnancy. They are usually scheduled monthly at first, then more often in the last 2 months before delivery.  Write down your questions. Take them to your prenatal visits.  Keep all your prenatal visits as directed by your   health care provider. Safety  Wear your seat belt at all times when driving.  Make a list of emergency phone numbers, including numbers for family, friends, the hospital, and police and fire departments. General Tips  Ask your health care provider for a referral to a local prenatal education class. Begin classes no later than at the beginning of month 6 of your pregnancy.  Ask for help if you have counseling or nutritional needs during pregnancy. Your health care provider can offer advice or refer you to specialists for help with various needs.  Do not use hot tubs, steam rooms, or saunas.  Do not douche or use tampons or scented sanitary pads.  Do not cross your legs for long periods of time.  Avoid cat litter boxes and soil used by cats. These carry germs that can cause birth defects in the baby and possibly loss of the fetus by miscarriage or stillbirth.  Avoid all smoking, herbs, alcohol, and medicines not prescribed by  your health care provider. Chemicals in these affect the formation and growth of the baby.  Do not use any tobacco products, including cigarettes, chewing tobacco, and electronic cigarettes. If you need help quitting, ask your health care provider. You may receive counseling support and other resources to help you quit.  Schedule a dentist appointment. At home, brush your teeth with a soft toothbrush and be gentle when you floss. SEEK MEDICAL CARE IF:   You have dizziness.  You have mild pelvic cramps, pelvic pressure, or nagging pain in the abdominal area.  You have persistent nausea, vomiting, or diarrhea.  You have a bad smelling vaginal discharge.  You have pain with urination.  You notice increased swelling in your face, hands, legs, or ankles. SEEK IMMEDIATE MEDICAL CARE IF:   You have a fever.  You are leaking fluid from your vagina.  You have spotting or bleeding from your vagina.  You have severe abdominal cramping or pain.  You have rapid weight gain or loss.  You vomit blood or material that looks like coffee grounds.  You are exposed to German measles and have never had them.  You are exposed to fifth disease or chickenpox.  You develop a severe headache.  You have shortness of breath.  You have any kind of trauma, such as from a fall or a car accident.   This information is not intended to replace advice given to you by your health care provider. Make sure you discuss any questions you have with your health care provider.   Document Released: 11/11/2001 Document Revised: 12/08/2014 Document Reviewed: 09/27/2013 Elsevier Interactive Patient Education 2016 Elsevier Inc.  

## 2016-01-09 NOTE — Progress Notes (Signed)
   Subjective:    Dana Cantu is a G1P0 [redacted]w[redacted]d being seen today for her first obstetrical visit.  Pt is from [place].  Patient does intend to breast feed. Pregnancy history fully reviewed.  Patient reports vaginal irritation x 2 days.  Denies vaginal odor.  Filed Vitals:   01/09/16 0929  BP: 116/79  Pulse: 90  Temp: 98.3 F (36.8 C)  Weight: 100 lb 8 oz (45.587 kg)    HISTORY: OB History  Gravida Para Term Preterm AB SAB TAB Ectopic Multiple Living  1             # Outcome Date GA Lbr Len/2nd Weight Sex Delivery Anes PTL Lv  1 Current              No past medical history on file. Past Surgical History  Procedure Laterality Date  . Tonsillectomy     Family History  Problem Relation Age of Onset  . Diabetes Father      Exam    BP 116/79 mmHg  Pulse 90  Temp(Src) 98.3 F (36.8 C)  Wt 100 lb 8 oz (45.587 kg)  LMP 10/27/2015 Uterine Size: size equals dates  Pelvic Exam:    Perineum: No Hemorrhoids, Normal Perineum   Vulva: normal   Vagina:  normal mucosa, white thick curd-like discharge; erythema, no palpable nodules   pH: Not done   Cervix: no bleeding following Pap, no cervical motion tenderness and no lesions   Adnexa: normal adnexa and no mass, fullness, tenderness   Bony Pelvis: Adequate  System: Breast:  No nipple retraction or dimpling, No nipple discharge or bleeding, No axillary or supraclavicular adenopathy, Normal to palpation without dominant masses   Skin: normal coloration and turgor, no rashes    Neurologic: negative   Extremities: normal strength, tone, and muscle mass   HEENT neck supple with midline trachea and thyroid without masses   Mouth/Teeth mucous membranes moist, pharynx normal without lesions   Neck supple and no masses   Cardiovascular: regular rate and rhythm, no murmurs or gallops   Respiratory:  appears well, vitals normal, no respiratory distress, acyanotic, normal RR, neck free of mass or lymphadenopathy, chest clear, no  wheezing, crepitations, rhonchi, normal symmetric air entry   Abdomen: soft, non-tender; bowel sounds normal; no masses,  no organomegaly   Urinary: urethral meatus normal      Assessment:    Pregnancy: G1P0 Patient Active Problem List   Diagnosis Date Noted  . Supervision of normal pregnancy, antepartum 01/09/2016  Vaginal Discharge      Plan:     Initial labs drawn.  Pap smear collected. RX Terazol cream Prenatal vitamins. Problem list reviewed and updated. Genetic Screening discussed First Screen: declined. Follow up in 4 weeks.  Marlis Edelson 01/09/2016

## 2016-01-10 LAB — PRESCRIPTION MONITORING PROFILE (19 PANEL)
AMPHETAMINE/METH: NEGATIVE ng/mL
BARBITURATE SCREEN, URINE: NEGATIVE ng/mL
BUPRENORPHINE, URINE: NEGATIVE ng/mL
Benzodiazepine Screen, Urine: NEGATIVE ng/mL
CANNABINOID SCRN UR: NEGATIVE ng/mL
CARISOPRODOL, URINE: NEGATIVE ng/mL
COCAINE METABOLITES: NEGATIVE ng/mL
CREATININE, URINE: 134.02 mg/dL (ref >20.0)
ECSTASY: NEGATIVE ng/mL
Fentanyl, Ur: NEGATIVE ng/mL
METHAQUALONE SCREEN (URINE): NEGATIVE ng/mL
Meperidine, Ur: NEGATIVE ng/mL
Methadone Screen, Urine: NEGATIVE ng/mL
Nitrites, Initial: NEGATIVE ug/mL
OXYCODONE SCRN UR: NEGATIVE ng/mL
Opiate Screen, Urine: NEGATIVE ng/mL
PH URINE, INITIAL: 7.9 pH (ref 4.5–8.9)
PHENCYCLIDINE, UR: NEGATIVE ng/mL
Propoxyphene: NEGATIVE ng/mL
TRAMADOL UR: NEGATIVE ng/mL
Tapentadol, urine: NEGATIVE ng/mL
Zolpidem, Urine: NEGATIVE ng/mL

## 2016-01-10 LAB — PRENATAL PROFILE (SOLSTAS)
Antibody Screen: NEGATIVE
Basophils Absolute: 0 10*3/uL (ref 0.0–0.1)
Basophils Relative: 1 % (ref 0–1)
EOS PCT: 2 % (ref 0–5)
Eosinophils Absolute: 0.1 10*3/uL (ref 0.0–0.7)
HCT: 40.2 % (ref 36.0–46.0)
HEP B S AG: NEGATIVE
HIV: NONREACTIVE
Hemoglobin: 13.3 g/dL (ref 12.0–15.0)
LYMPHS PCT: 29 % (ref 12–46)
Lymphs Abs: 1.2 10*3/uL (ref 0.7–4.0)
MCH: 29.9 pg (ref 26.0–34.0)
MCHC: 33.1 g/dL (ref 30.0–36.0)
MCV: 90.3 fL (ref 78.0–100.0)
MONO ABS: 0.4 10*3/uL (ref 0.1–1.0)
MONOS PCT: 10 % (ref 3–12)
MPV: 9.9 fL (ref 8.6–12.4)
NEUTROS ABS: 2.3 10*3/uL (ref 1.7–7.7)
Neutrophils Relative %: 58 % (ref 43–77)
PLATELETS: 251 10*3/uL (ref 150–400)
RBC: 4.45 MIL/uL (ref 3.87–5.11)
RDW: 14.2 % (ref 11.5–15.5)
RH TYPE: POSITIVE
RUBELLA: 9.45 {index} — AB (ref <0.90)
WBC: 4 10*3/uL (ref 4.0–10.5)

## 2016-01-10 LAB — GLUCOSE TOLERANCE, 1 HOUR (50G) W/O FASTING: Glucose, 1 Hour GTT: 80 mg/dL (ref 70–140)

## 2016-01-10 LAB — CYTOLOGY - PAP

## 2016-01-11 LAB — HEMOGLOBINOPATHY EVALUATION
HEMOGLOBIN OTHER: 0 %
HGB A2 QUANT: 2.6 % (ref 2.2–3.2)
HGB A: 97.4 % (ref 96.8–97.8)
HGB F QUANT: 0 % (ref 0.0–2.0)
Hgb S Quant: 0 %

## 2016-01-11 LAB — CULTURE, OB URINE

## 2016-01-16 ENCOUNTER — Telehealth: Payer: Self-pay | Admitting: General Practice

## 2016-01-16 NOTE — Telephone Encounter (Signed)
Patient came by front office requesting test results. Informed patient of negative/normal results. Patient verbalized understanding & had no questions

## 2016-01-17 ENCOUNTER — Inpatient Hospital Stay (HOSPITAL_COMMUNITY)
Admission: AD | Admit: 2016-01-17 | Discharge: 2016-01-17 | Disposition: A | Discharge status: Home / Self Care | Payer: Medicaid Other | Source: Ambulatory Visit | Attending: Obstetrics & Gynecology | Admitting: Obstetrics & Gynecology

## 2016-01-17 ENCOUNTER — Encounter (HOSPITAL_COMMUNITY): Payer: Self-pay

## 2016-01-17 DIAGNOSIS — Z3A13 13 weeks gestation of pregnancy: Secondary | ICD-10-CM | POA: Diagnosis not present

## 2016-01-17 DIAGNOSIS — O26891 Other specified pregnancy related conditions, first trimester: Secondary | ICD-10-CM | POA: Diagnosis not present

## 2016-01-17 DIAGNOSIS — O9989 Other specified diseases and conditions complicating pregnancy, childbirth and the puerperium: Secondary | ICD-10-CM

## 2016-01-17 DIAGNOSIS — R109 Unspecified abdominal pain: Secondary | ICD-10-CM | POA: Diagnosis present

## 2016-01-17 DIAGNOSIS — N949 Unspecified condition associated with female genital organs and menstrual cycle: Secondary | ICD-10-CM

## 2016-01-17 LAB — URINALYSIS, ROUTINE W REFLEX MICROSCOPIC
BILIRUBIN URINE: NEGATIVE
GLUCOSE, UA: NEGATIVE mg/dL
Hgb urine dipstick: NEGATIVE
KETONES UR: 15 mg/dL — AB
Leukocytes, UA: NEGATIVE
Nitrite: NEGATIVE
PH: 6.5 (ref 5.0–8.0)
Protein, ur: NEGATIVE mg/dL
Specific Gravity, Urine: 1.02 (ref 1.005–1.030)

## 2016-01-17 NOTE — MAU Note (Signed)
Pt lower abdominal cramping that started last night. Denies vag bleeding. Pt recently treated for yeast infection last week. Was given terazol cream. States that infection seems to be getting better now. States that she does have some burning with urination.

## 2016-01-17 NOTE — MAU Provider Note (Signed)
History     CSN: [id number]  Arrival date and time: 01/17/16 2032   First Provider Initiated Contact with Patient 01/17/16 2119      Chief Complaint  Patient presents with  . Abdominal Pain   Abdominal Pain This is a new problem. The current episode started yesterday. The onset quality is gradual. The problem occurs intermittently. The problem has been waxing and waning. The pain is located in the LLQ and RLQ. The pain is at a severity of 0/10 (no pain currenlty, but when it comes it ranges b/w 2-5/10 ). The quality of the pain is sharp. The abdominal pain does not radiate. Associated symptoms include dysuria, nausea and vomiting. Pertinent negatives include no constipation, diarrhea, fever or frequency. Nothing aggravates the pain. The pain is relieved by nothing. She has tried nothing for the symptoms. The treatment provided no relief.    History reviewed. No pertinent past medical history.  Past Surgical History  Procedure Laterality Date  . Tonsillectomy      Family History  Problem Relation Age of Onset  . Diabetes Father     Social History  Substance Use Topics  . Smoking status: Never Smoker   . Smokeless tobacco: None  . Alcohol Use: No    Allergies: No Known Allergies  Prescriptions prior to admission  Medication Sig Dispense Refill Last Dose  . terconazole (TERAZOL 7) 0.4 % vaginal cream Place 1 applicator vaginally at bedtime. 45 g 0 Past Week at Unknown time  . PRENATAL 28-0.8 MG TABS Take 1 tablet by mouth daily. (Patient not taking: Reported on 01/09/2016) 30 each 12 Not Taking    Review of Systems  Constitutional: Negative for fever and chills.  Gastrointestinal: Positive for nausea, vomiting and abdominal pain. Negative for diarrhea and constipation.  Genitourinary: Positive for dysuria. Negative for urgency and frequency.       States that she has been using yeast infection treatment, and she is having less irritation with urination.    Physical Exam    Blood pressure 115/66, pulse 87, temperature 98.5 F (36.9 C), temperature source Oral, resp. rate 18, height  (1.575 m), weight 46.72 kg (103 lb), last menstrual period 10/27/2015, SpO2 100 %.  Physical Exam  Nursing note and vitals reviewed. Constitutional: She is oriented to person, place, and time. She appears well-developed and well-nourished. No distress.  HENT:  Head: Normocephalic.  Cardiovascular: Normal rate.   Respiratory: Effort normal.  GI: Soft. There is no tenderness. There is no rebound.  Neurological: She is alert and oriented to person, place, and time.  Skin: Skin is warm and dry.  Psychiatric: She has a normal mood and affect.   Results for orders placed or performed during the hospital encounter of 01/17/16 (from the past 24 hour(s))  Urinalysis, Routine w reflex microscopic (not at [facility])     Status: Abnormal   Collection Time: 01/17/16  8:36 PM  Result Value Ref Range   Color, Urine YELLOW YELLOW   APPearance CLEAR CLEAR   Specific Gravity, Urine 1.020 1.005 - 1.030   pH 6.5 5.0 - 8.0   Glucose, UA NEGATIVE NEGATIVE mg/dL   Hgb urine dipstick NEGATIVE NEGATIVE   Bilirubin Urine NEGATIVE NEGATIVE   Ketones, ur 15 (A) NEGATIVE mg/dL   Protein, ur NEGATIVE NEGATIVE mg/dL   Nitrite NEGATIVE NEGATIVE   Leukocytes, UA NEGATIVE NEGATIVE    MAU Course  Procedures  MDM   Assessment and Plan   1. Round ligament pain  DC home Comfort measures reviewed  1st Trimester precautions  RX: NONE  Return to MAU as needed FU with OB as planned  Follow-up Information    Follow up with [facility].   Specialty:  Obstetrics and Gynecology   Contact information:   [address] [place] [address] [phone]        Tawnya Crook 01/17/2016, 9:23 PM

## 2016-01-17 NOTE — Discharge Instructions (Signed)

## 2016-02-06 ENCOUNTER — Ambulatory Visit (INDEPENDENT_AMBULATORY_CARE_PROVIDER_SITE_OTHER): Payer: Medicaid Other | Admitting: Certified Nurse Midwife

## 2016-02-06 VITALS — BP 105/59 | HR 80 | Temp 98.3°F | Wt 103.5 lb

## 2016-02-06 DIAGNOSIS — Z3482 Encounter for supervision of other normal pregnancy, second trimester: Secondary | ICD-10-CM | POA: Diagnosis not present

## 2016-02-06 LAB — POCT URINALYSIS DIP (DEVICE)
Bilirubin Urine: NEGATIVE
Glucose, UA: NEGATIVE mg/dL
Hgb urine dipstick: NEGATIVE
Ketones, ur: NEGATIVE mg/dL
Nitrite: NEGATIVE
Protein, ur: NEGATIVE mg/dL
Specific Gravity, Urine: 1.015 (ref 1.005–1.030)
Urobilinogen, UA: 0.2 mg/dL (ref 0.0–1.0)
pH: 6.5 (ref 5.0–8.0)

## 2016-02-06 NOTE — Addendum Note (Signed)
Addended by: Rockwell GermanyFIELDS, Lylianna Fraiser A on: 02/06/2016 09:35 AM   Modules accepted: Orders

## 2016-02-06 NOTE — Progress Notes (Signed)
Subjective:  Dana Cantu is a 30 y.o. G1P0 at 3267w3d being seen today for ongoing prenatal care.  She is currently monitored for the following issues for this low-risk pregnancy and has Supervision of normal pregnancy, antepartum on her problem list.  Patient reports no complaints.  Contractions: Not present. Vag. Bleeding: None.  Movement: Present. Denies leaking of fluid.   The following portions of the patient's history were reviewed and updated as appropriate: allergies, current medications, past family history, past medical history, past social history, past surgical history and problem list. Problem list updated.  Objective:   Filed Vitals:   02/06/16 0853  BP: 105/59  Pulse: 80  Temp: 98.3 F (36.8 C)  Weight: 103 lb 8 oz (46.947 kg)    Fetal Status:     Movement: Present     General:  Alert, oriented and cooperative. Patient is in no acute distress.  Skin: Skin is warm and dry. No rash noted.   Cardiovascular: Normal heart rate noted  Respiratory: Normal respiratory effort, no problems with respiration noted  Abdomen: Soft, gravid, appropriate for gestational age. Pain/Pressure: Present     Pelvic: Vag. Bleeding: None     Cervical exam deferred        Extremities: Normal range of motion.  Edema: None  Mental Status: Normal mood and affect. Normal behavior. Normal judgment and thought content.   Urinalysis:      Assessment and Plan:  Pregnancy: G1P0 at 9567w3d  1. Supervision of normal pregnancy, antepartum, second trimester   Preterm labor symptoms and general obstetric precautions including but not limited to vaginal bleeding, contractions, leaking of fluid and fetal movement were reviewed in detail with the patient. Please refer to After Visit Summary for other counseling recommendations.  Return in about 4 weeks (around 03/05/2016).  Schedule anatomy u/s Rhea PinkLori A Angalina Ante, CNM

## 2016-02-06 NOTE — Patient Instructions (Signed)

## 2016-02-12 ENCOUNTER — Telehealth: Payer: Self-pay | Admitting: Family Medicine

## 2016-02-12 NOTE — Telephone Encounter (Signed)
Spoke with patient and she will call her OBGYN to see if she is ok to get it.  She will let us know when she receives it.

## 2016-02-16 ENCOUNTER — Encounter (HOSPITAL_COMMUNITY): Payer: Self-pay | Admitting: *Deleted

## 2016-02-16 ENCOUNTER — Inpatient Hospital Stay (HOSPITAL_COMMUNITY)
Admission: AD | Admit: 2016-02-16 | Discharge: 2016-02-16 | Disposition: A | Discharge status: Home / Self Care | Payer: Medicaid Other | Source: Ambulatory Visit | Attending: Obstetrics & Gynecology | Admitting: Obstetrics & Gynecology

## 2016-02-16 DIAGNOSIS — N939 Abnormal uterine and vaginal bleeding, unspecified: Secondary | ICD-10-CM | POA: Diagnosis present

## 2016-02-16 DIAGNOSIS — O4692 Antepartum hemorrhage, unspecified, second trimester: Secondary | ICD-10-CM

## 2016-02-16 DIAGNOSIS — Z833 Family history of diabetes mellitus: Secondary | ICD-10-CM | POA: Insufficient documentation

## 2016-02-16 DIAGNOSIS — Z3A15 15 weeks gestation of pregnancy: Secondary | ICD-10-CM | POA: Diagnosis not present

## 2016-02-16 DIAGNOSIS — O209 Hemorrhage in early pregnancy, unspecified: Secondary | ICD-10-CM | POA: Diagnosis not present

## 2016-02-16 LAB — WET PREP, GENITAL
CLUE CELLS WET PREP: NONE SEEN
SPERM: NONE SEEN
TRICH WET PREP: NONE SEEN
YEAST WET PREP: NONE SEEN

## 2016-02-16 LAB — URINE MICROSCOPIC-ADD ON
BACTERIA UA: NONE SEEN
WBC, UA: NONE SEEN WBC/hpf (ref 0–5)

## 2016-02-16 LAB — URINALYSIS, ROUTINE W REFLEX MICROSCOPIC
Bilirubin Urine: NEGATIVE
Glucose, UA: NEGATIVE mg/dL
Ketones, ur: NEGATIVE mg/dL
Leukocytes, UA: NEGATIVE
NITRITE: NEGATIVE
Protein, ur: NEGATIVE mg/dL
SPECIFIC GRAVITY, URINE: 1.02 (ref 1.005–1.030)
pH: 7 (ref 5.0–8.0)

## 2016-02-16 NOTE — Discharge Instructions (Signed)
Pelvic Rest °Pelvic rest is sometimes recommended for women when:  °· The placenta is partially or completely covering the opening of the cervix (placenta previa). °· There is bleeding between the uterine wall and the amniotic sac in the first trimester (subchorionic hemorrhage). °· The cervix begins to open without labor starting (incompetent cervix, cervical insufficiency). °· The labor is too early (preterm labor). °HOME CARE INSTRUCTIONS °· Do not have sexual intercourse, stimulation, or an orgasm. °· Do not use tampons, douche, or put anything in the vagina. °· Do not lift anything over 10 pounds (4.5 kg). °· Avoid strenuous activity or straining your pelvic muscles. °SEEK MEDICAL CARE IF:  °· You have any vaginal bleeding during pregnancy. Treat this as a potential emergency. °· You have cramping pain felt low in the stomach (stronger than menstrual cramps). °· You notice vaginal discharge (watery, mucus, or bloody). °· You have a low, dull backache. °· There are regular contractions or uterine tightening. °SEEK IMMEDIATE MEDICAL CARE IF: °You have vaginal bleeding and have placenta previa.  °  °This information is not intended to replace advice given to you by your health care provider. Make sure you discuss any questions you have with your health care provider. °  °Document Released: 03/14/2011 Document Revised: 02/09/2012 Document Reviewed: 05/21/2015 °Elsevier Interactive Patient Education ©2016 [employer]vier Inc[employer] ° ° °Vaginal Bleeding During Pregnancy, Second Trimester ° A small amount of bleeding (spotting) from the vagina is common in pregnancy. Sometimes the bleeding is normal and is not a problem, and sometimes it is a sign of something serious. Be sure to tell your doctor about any bleeding from your vagina right away. °HOME CARE °· Watch your condition for any changes. °· Follow your doctor's instructions about how active you can be. °· If you are on bed rest: °¨ You may need to stay in bed and only get  up to use the bathroom. °¨ You may be allowed to do some activities. °¨ If you need help, make plans for someone to help you. °· Write down: °¨ The number of pads you use each day. °¨ How often you change pads. °¨ How soaked (saturated) your pads are. °· Do not use tampons. °· Do not douche. °· Do not have sex or orgasms until your doctor says it is okay. °· If you pass any tissue from your vagina, save the tissue so you can show it to your doctor. °· Only take medicines as told by your doctor. °· Do not take aspirin because it can make you bleed. °· Do not exercise, lift heavy weights, or do any activities that take a lot of energy and effort unless your doctor says it is okay. °· Keep all follow-up visits as told by your doctor. °GET HELP IF:  °· You bleed from your vagina. °· You have cramps. °· You have labor pains. °· You have a fever that does not go away after you take medicine. °GET HELP RIGHT AWAY IF: °· You have very bad cramps in your back or belly (abdomen). °· You have contractions. °· You have chills. °· You pass large clots or tissue from your vagina. °· You bleed more. °· You feel light-headed or weak. °· You pass out (faint). °· You are leaking fluid or have a gush of fluid from your vagina. °MAKE SURE YOU: °· Understand these instructions. °· Will watch your condition. °· Will get help right away if you are not doing well or get worse. °  °This information   is not intended to replace advice given to you by your health care provider. Make sure you discuss any questions you have with your health care provider. °  °Document Released: 04/03/2014 Document Reviewed: 04/03/2014 °Elsevier Interactive Patient Education ©2016 Elsevier Inc. ° °

## 2016-02-16 NOTE — MAU Note (Signed)
Patient states she had sex this morning and started spotting afterward.  She is wearing a mini pad and has had some spotting on it.  Denies any pain or other LOF.

## 2016-02-16 NOTE — MAU Provider Note (Signed)
History     CSN: [id number]  Arrival date and time: 02/16/16 1420   First Provider Initiated Contact with Patient 02/16/16 1502      Chief Complaint  Patient presents with  . Vaginal Bleeding   HPI   [name] is a 30 y.o. female G1P0 at 39109w6d presenting to MAU with vaginal bleeding following intercourse this morning. The bleeding is described as red in color; small amount, no pad needed.  Denies pain.   She has never had bleeding in this pregnancy.   OB History    Gravida Para Term Preterm AB TAB SAB Ectopic Multiple Living   1               History reviewed. No pertinent past medical history.  Past Surgical History  Procedure Laterality Date  . Tonsillectomy      Family History  Problem Relation Age of Onset  . Diabetes Father     Social History  Substance Use Topics  . Smoking status: Never Smoker   . Smokeless tobacco: None  . Alcohol Use: No    Allergies: No Known Allergies  Prescriptions prior to admission  Medication Sig Dispense Refill Last Dose  . Prenat-Fe Carbonyl-FA-Omega 3 (ONE-A-DAY WOMENS PRENATAL 1 PO) Take by mouth.   Taking  . terconazole (TERAZOL 7) 0.4 % vaginal cream Place 1 applicator vaginally at bedtime. (Patient not taking: Reported on 02/06/2016) 45 g 0 Not Taking   Results for orders placed or performed during the hospital encounter of 02/16/16 (from the past 48 hour(s))  Urinalysis, Routine w reflex microscopic ([facility])     Status: Abnormal   Collection Time: 02/16/16  2:28 PM  Result Value Ref Range   Color, Urine YELLOW YELLOW   APPearance CLEAR CLEAR   Specific Gravity, Urine 1.020 1.005 - 1.030   pH 7.0 5.0 - 8.0   Glucose, UA NEGATIVE NEGATIVE mg/dL   Hgb urine dipstick LARGE (A) NEGATIVE   Bilirubin Urine NEGATIVE NEGATIVE   Ketones, ur NEGATIVE NEGATIVE mg/dL   Protein, ur NEGATIVE NEGATIVE mg/dL   Nitrite NEGATIVE NEGATIVE   Leukocytes, UA NEGATIVE NEGATIVE  Urine microscopic-add on     Status: Abnormal    Collection Time: 02/16/16  2:28 PM  Result Value Ref Range   Squamous Epithelial / LPF 0-5 (A) NONE SEEN   WBC, UA NONE SEEN 0 - 5 WBC/hpf   RBC / HPF 6-30 0 - 5 RBC/hpf   Bacteria, UA NONE SEEN NONE SEEN   Urine-Other MUCOUS PRESENT   Wet prep, genital     Status: Abnormal   Collection Time: 02/16/16  3:18 PM  Result Value Ref Range   Yeast Wet Prep HPF POC NONE SEEN NONE SEEN   Trich, Wet Prep NONE SEEN NONE SEEN   Clue Cells Wet Prep HPF POC NONE SEEN NONE SEEN   WBC, Wet Prep HPF POC FEW (A) NONE SEEN    Comment: FEW BACTERIA SEEN   Sperm NONE SEEN     Review of Systems  Constitutional: Negative for fever.  Gastrointestinal: Negative for abdominal pain.  Genitourinary: Negative for dysuria and urgency.  Neurological: Positive for headaches.   Physical Exam   Blood pressure 95/61, pulse 93, temperature 98.8 F (37.1 C), temperature source Oral, resp. rate 16, height 5' 2.5" (1.588 m), last menstrual period 10/27/2015, SpO2 100 %.  Physical Exam  Constitutional: She is oriented to person, place, and time. She appears well-developed and well-nourished.  HENT:  Head:  Normocephalic.  Eyes: Pupils are equal, round, and reactive to light.  Genitourinary:  Speculum exam: Vagina - Small amount dark red blood in the vagina.  Cervix - scant amount of dark red blood oozing from the os.  Bimanual exam: deferred  Wet prep done Chaperone present for exam.  Musculoskeletal: Normal range of motion.  Neurological: She is alert and oriented to person, place, and time.  Skin: Skin is warm. She is not diaphoretic.  Psychiatric: Her behavior is normal.    MAU Course  Procedures  None  MDM  A positive blood type + fetal heart tones via doppler.   Assessment and Plan   A:  1. Vaginal bleeding in pregnancy, second trimester     P:  Discharge home in stable condition  Bleeding precautions  Pelvic rest until anatomy US complete Return to MAU if symptoms worsen.      Duane Lope, NP 02/16/2016 6:29 PM

## 2016-03-05 ENCOUNTER — Ambulatory Visit (INDEPENDENT_AMBULATORY_CARE_PROVIDER_SITE_OTHER): Payer: Medicaid Other | Admitting: Advanced Practice Midwife

## 2016-03-05 VITALS — BP 104/67 | HR 91 | Temp 98.1°F | Wt 108.0 lb

## 2016-03-05 DIAGNOSIS — Z3482 Encounter for supervision of other normal pregnancy, second trimester: Secondary | ICD-10-CM

## 2016-03-05 LAB — POCT URINALYSIS DIP (DEVICE)
Bilirubin Urine: NEGATIVE
GLUCOSE, UA: NEGATIVE mg/dL
KETONES UR: NEGATIVE mg/dL
LEUKOCYTES UA: NEGATIVE
Nitrite: NEGATIVE
Protein, ur: NEGATIVE mg/dL
SPECIFIC GRAVITY, URINE: 1.01 (ref 1.005–1.030)
UROBILINOGEN UA: 0.2 mg/dL (ref 0.0–1.0)
pH: 5.5 (ref 5.0–8.0)

## 2016-03-05 NOTE — Patient Instructions (Signed)

## 2016-03-05 NOTE — Progress Notes (Signed)
Subjective:  Dana Cantu is a 30 y.o. G1P0 at 5335w3d being seen today for ongoing prenatal care.  She is currently monitored for the following issues for this low-risk pregnancy and has Supervision of normal pregnancy, antepartum on her problem list.  Patient reports no complaints.  Contractions: Not present. Vag. Bleeding: None.  Movement: Present. Denies leaking of fluid.   The following portions of the patient's history were reviewed and updated as appropriate: allergies, current medications, past family history, past medical history, past social history, past surgical history and problem list. Problem list updated.  Objective:   Filed Vitals:   03/05/16 0805  BP: 104/67  Pulse: 91  Temp: 98.1 F (36.7 C)  Weight: 108 lb (48.988 kg)    Fetal Status: Fetal Heart Rate (bpm): 140   Movement: Present     General:  Alert, oriented and cooperative. Patient is in no acute distress.  Skin: Skin is warm and dry. No rash noted.   Cardiovascular: Normal heart rate noted  Respiratory: Normal respiratory effort, no problems with respiration noted  Abdomen: Soft, gravid, appropriate for gestational age. Pain/Pressure: Present     Pelvic: Vag. Bleeding: None     Cervical exam deferred        Extremities: Normal range of motion.  Edema: None  Mental Status: Normal mood and affect. Normal behavior. Normal judgment and thought content.   Urinalysis:    Specimen not left  Assessment and Plan:  Pregnancy: G1P0 at 7135w3d  1. Supervision of normal pregnancy, antepartum, second trimester  - Anatomy scan scheduled 03/06/16  Preterm labor symptoms and general obstetric precautions including but not limited to vaginal bleeding, contractions, leaking of fluid and fetal movement were reviewed in detail with the patient. Please refer to After Visit Summary for other counseling recommendations.  Return in about 4 weeks (around 04/02/2016).   Dorathy KinsmanVirginia Jaiona Simien, CNM

## 2016-03-05 NOTE — Progress Notes (Signed)
Reviewed tip of week with patient  

## 2016-03-06 ENCOUNTER — Ambulatory Visit (HOSPITAL_COMMUNITY)
Admission: RE | Admit: 2016-03-06 | Discharge: 2016-03-06 | Disposition: A | Discharge status: Home / Self Care | Payer: Medicaid Other | Source: Ambulatory Visit | Attending: Certified Nurse Midwife | Admitting: Certified Nurse Midwife

## 2016-03-06 DIAGNOSIS — Z3A18 18 weeks gestation of pregnancy: Secondary | ICD-10-CM | POA: Diagnosis not present

## 2016-03-06 DIAGNOSIS — Z3482 Encounter for supervision of other normal pregnancy, second trimester: Secondary | ICD-10-CM

## 2016-03-06 DIAGNOSIS — Z36 Encounter for antenatal screening of mother: Secondary | ICD-10-CM | POA: Diagnosis present

## 2016-04-02 ENCOUNTER — Ambulatory Visit (INDEPENDENT_AMBULATORY_CARE_PROVIDER_SITE_OTHER): Payer: Medicaid Other | Admitting: Advanced Practice Midwife

## 2016-04-02 VITALS — BP 112/70 | HR 99 | Wt 118.9 lb

## 2016-04-02 DIAGNOSIS — Z3482 Encounter for supervision of other normal pregnancy, second trimester: Secondary | ICD-10-CM | POA: Diagnosis not present

## 2016-04-02 LAB — POCT URINALYSIS DIP (DEVICE)
BILIRUBIN URINE: NEGATIVE
GLUCOSE, UA: NEGATIVE mg/dL
Hgb urine dipstick: NEGATIVE
Ketones, ur: NEGATIVE mg/dL
LEUKOCYTES UA: NEGATIVE
NITRITE: NEGATIVE
PH: 7.5 (ref 5.0–8.0)
Protein, ur: NEGATIVE mg/dL
Specific Gravity, Urine: 1.02 (ref 1.005–1.030)
Urobilinogen, UA: 0.2 mg/dL (ref 0.0–1.0)

## 2016-04-02 NOTE — Patient Instructions (Signed)

## 2016-04-02 NOTE — Progress Notes (Signed)
Subjective:  Dana Cantu is a 30 y.o. G1P0 at 2811w3d being seen today for ongoing prenatal care.  She is currently monitored for the following issues for this low-risk pregnancy and has Supervision of normal pregnancy, antepartum on her problem list.  Patient reports no complaints.  Contractions: Not present. Vag. Bleeding: None.  Movement: Present. Denies leaking of fluid.   The following portions of the patient's history were reviewed and updated as appropriate: allergies, current medications, past family history, past medical history, past social history, past surgical history and problem list. Problem list updated.  Objective:   Filed Vitals:   04/02/16 1056  BP: 112/70  Pulse: 99  Weight: 118 lb 14.4 oz (53.933 kg)    Fetal Status: Fetal Heart Rate (bpm): 147   Movement: Present     General:  Alert, oriented and cooperative. Patient is in no acute distress.  Skin: Skin is warm and dry. No rash noted.   Cardiovascular: Normal heart rate noted  Respiratory: Normal respiratory effort, no problems with respiration noted  Abdomen: Soft, gravid, appropriate for gestational age. Pain/Pressure: Absent     Pelvic: Vag. Bleeding: None     Cervical exam deferred        Extremities: Normal range of motion.  Edema: None  Mental Status: Normal mood and affect. Normal behavior. Normal judgment and thought content.   Urinalysis: Urine Protein: Negative Urine Glucose: Negative  Assessment and Plan:  Pregnancy: G1P0 at 3511w3d  1. Supervision of normal pregnancy, antepartum, second trimester   Preterm labor symptoms and general obstetric precautions including but not limited to vaginal bleeding, contractions, leaking of fluid and fetal movement were reviewed in detail with the patient. Please refer to After Visit Summary for other counseling recommendations.  Return in about 4 weeks (around 04/30/2016).   Hurshel PartyLisa A Leftwich-Kirby, CNM

## 2016-04-10 ENCOUNTER — Encounter: Payer: Self-pay | Admitting: Family Medicine

## 2016-04-30 ENCOUNTER — Ambulatory Visit (INDEPENDENT_AMBULATORY_CARE_PROVIDER_SITE_OTHER): Payer: Medicaid Other | Admitting: Certified Nurse Midwife

## 2016-04-30 VITALS — BP 121/67 | HR 108 | Wt 133.3 lb

## 2016-04-30 DIAGNOSIS — Z3482 Encounter for supervision of other normal pregnancy, second trimester: Secondary | ICD-10-CM | POA: Diagnosis not present

## 2016-04-30 LAB — POCT URINALYSIS DIP (DEVICE)
Bilirubin Urine: NEGATIVE
Glucose, UA: NEGATIVE mg/dL
Hgb urine dipstick: NEGATIVE
Ketones, ur: NEGATIVE mg/dL
Leukocytes, UA: NEGATIVE
Nitrite: NEGATIVE
Protein, ur: NEGATIVE mg/dL
Specific Gravity, Urine: 1.025 (ref 1.005–1.030)
Urobilinogen, UA: 0.2 mg/dL (ref 0.0–1.0)
pH: 6.5 (ref 5.0–8.0)

## 2016-04-30 NOTE — Patient Instructions (Signed)
Glucose Tolerance Test During Pregnancy The glucose tolerance test (GTT) is a blood test used to determine if you have developed a type of diabetes during pregnancy (gestational diabetes). This is when your body does not properly process sugar (glucose) in the food you eat, resulting in high blood glucose levels. Typically, a GTT is done after you have had a 1-hour glucose test with results that indicate you possibly have gestational diabetes. It may also be done if:  You have a history of giving birth to very large babies or have experienced repeated fetal loss (stillbirth).   You have signs and symptoms of diabetes, such as:   Changes in your vision.   Tingling or numbness in your hands or feet.   Changes in hunger, thirst, and urination not otherwise explained by your pregnancy.  The GTT lasts about 3 hours. You will be given a sugar-water solution to drink at the beginning of the test. You will have blood drawn before you drink the solution and then again 1, 2, and 3 hours after you drink it. You will not be allowed to eat or drink anything else during the test. You must remain at the testing location to make sure that your blood is drawn on time. You should also avoid exercising during the test, because exercise can alter test results. PREPARATION FOR TEST  Eat normally for 3 days prior to the GTT test, including having plenty of carbohydrate-rich foods. Do not eat or drink anything except water during the final 12 hours before the test. In addition, your health care provider may ask you to stop taking certain medicines before the test. RESULTS  It is your responsibility to obtain your test results. Ask the lab or department performing the test when and how you will get your results. Contact your health care provider to discuss any questions you have about your results.  Range of Normal Values Ranges for normal values may vary among different labs and hospitals. You should always check  with your health care provider after having lab work or other tests done to discuss whether your values are considered within normal limits. Normal levels of blood glucose are as follows:  Fasting: less than 105 mg/dL.   1 hour after drinking the solution: less than 190 mg/dL.   2 hours after drinking the solution: less than 165 mg/dL.   3 hours after drinking the solution: less than 145 mg/dL.  Some substances can interfere with GTT results. These may include:  Blood pressure and heart failure medicines, including beta blockers, furosemide, and thiazides.   Anti-inflammatory medicines, including aspirin.   Nicotine.   Some psychiatric medicines.  Meaning of Results Outside Normal Value Ranges GTT test results that are above normal values may indicate a number of health problems, such as:   Gestational diabetes.   Acute stress response.   Cushing syndrome.   Tumors such as pheochromocytoma or glucagonoma.   Long-term kidney problems.   Pancreatitis.   Hyperthyroidism.   Current infection.  Discuss your test results with your health care provider. He or she will use the results to make a diagnosis and determine a treatment plan that is right for you.   This information is not intended to replace advice given to you by your health care provider. Make sure you discuss any questions you have with your health care provider.   Document Released: 05/18/2012 Document Revised: 12/08/2014 Document Reviewed: 03/24/2014 Elsevier Interactive Patient Education 2016 Elsevier Inc.  

## 2016-04-30 NOTE — Progress Notes (Signed)
Subjective:  Dana Cantu is a 30 y.o. G1P0 at 3564w3d being seen today for ongoing prenatal care.  She is currently monitored for the following issues for this low-risk pregnancy and has Supervision of normal pregnancy, antepartum on her problem list.  Patient reports no complaints.  Contractions: Not present. Vag. Bleeding: None.  Movement: Present. Denies leaking of fluid.   The following portions of the patient's history were reviewed and updated as appropriate: allergies, current medications, past family history, past medical history, past social history, past surgical history and problem list. Problem list updated.  Objective:   Filed Vitals:   04/30/16 0822  BP: 121/67  Pulse: 108  Weight: 133 lb 4.8 oz (60.464 kg)    Fetal Status: Fetal Heart Rate (bpm): 140   Movement: Present     General:  Alert, oriented and cooperative. Patient is in no acute distress.  Skin: Skin is warm and dry. No rash noted.   Cardiovascular: Normal heart rate noted  Respiratory: Normal respiratory effort, no problems with respiration noted  Abdomen: Soft, gravid, appropriate for gestational age. Pain/Pressure: Present     Pelvic: Vag. Bleeding: None     Cervical exam deferred        Extremities: Normal range of motion.     Mental Status: Normal mood and affect. Normal behavior. Normal judgment and thought content.   Urinalysis: Urine Protein: Negative Urine Glucose: Negative  Assessment and Plan:  Pregnancy: G1P0 at 5764w3d  1. Supervision of normal pregnancy, antepartum, second trimester   Preterm labor symptoms and general obstetric precautions including but not limited to vaginal bleeding, contractions, leaking of fluid and fetal movement were reviewed in detail with the patient. Please refer to After Visit Summary for other counseling recommendations.  Return in about 2 weeks (around 05/14/2016) for 1 hour gtt.   Rhea PinkLori A Rusell Meneely, CNM

## 2016-05-14 ENCOUNTER — Encounter: Payer: Medicaid Other | Admitting: Medical

## 2016-05-14 ENCOUNTER — Ambulatory Visit (INDEPENDENT_AMBULATORY_CARE_PROVIDER_SITE_OTHER): Payer: Medicaid Other | Admitting: Medical

## 2016-05-14 VITALS — BP 105/66 | HR 101 | Wt 134.3 lb

## 2016-05-14 DIAGNOSIS — B373 Candidiasis of vulva and vagina: Secondary | ICD-10-CM

## 2016-05-14 DIAGNOSIS — O98813 Other maternal infectious and parasitic diseases complicating pregnancy, third trimester: Secondary | ICD-10-CM

## 2016-05-14 DIAGNOSIS — O9989 Other specified diseases and conditions complicating pregnancy, childbirth and the puerperium: Secondary | ICD-10-CM

## 2016-05-14 DIAGNOSIS — Z3483 Encounter for supervision of other normal pregnancy, third trimester: Secondary | ICD-10-CM

## 2016-05-14 DIAGNOSIS — O99891 Other specified diseases and conditions complicating pregnancy: Secondary | ICD-10-CM

## 2016-05-14 DIAGNOSIS — B3731 Acute candidiasis of vulva and vagina: Secondary | ICD-10-CM

## 2016-05-14 DIAGNOSIS — R8271 Bacteriuria: Secondary | ICD-10-CM

## 2016-05-14 LAB — CBC
HEMATOCRIT: 30.2 % — AB (ref 35.0–45.0)
HEMOGLOBIN: 10 g/dL — AB (ref 11.7–15.5)
MCH: 28.3 pg (ref 27.0–33.0)
MCHC: 33.1 g/dL (ref 32.0–36.0)
MCV: 85.6 fL (ref 80.0–100.0)
MPV: 8.8 fL (ref 7.5–12.5)
Platelets: 274 10*3/uL (ref 140–400)
RBC: 3.53 MIL/uL — AB (ref 3.80–5.10)
RDW: 14.1 % (ref 11.0–15.0)
WBC: 6.4 10*3/uL (ref 3.8–10.8)

## 2016-05-14 LAB — POCT URINALYSIS DIP (DEVICE)
Bilirubin Urine: NEGATIVE
Glucose, UA: NEGATIVE mg/dL
Ketones, ur: NEGATIVE mg/dL
Nitrite: NEGATIVE
Protein, ur: 30 mg/dL — AB
Specific Gravity, Urine: 1.015 (ref 1.005–1.030)
Urobilinogen, UA: 0.2 mg/dL (ref 0.0–1.0)
pH: 7.5 (ref 5.0–8.0)

## 2016-05-14 MED ORDER — MICONAZOLE NITRATE APPLICATOR 200 & 2 MG-% (9GM) VA KIT: 1.0000 | PACK | Freq: Every day | VAGINAL | Status: DC

## 2016-05-14 NOTE — Patient Instructions (Signed)
Third Trimester of Pregnancy The third trimester is from week 29 through week 42, months 7 through 9. This trimester is when your unborn baby (fetus) is growing very fast. At the end of the ninth month, the unborn baby is about 20 inches in length. It weighs about 6-10 pounds.  HOME CARE   Avoid all smoking, herbs, and alcohol. Avoid drugs not approved by your doctor.  Do not use any tobacco products, including cigarettes, chewing tobacco, and electronic cigarettes. If you need help quitting, ask your doctor. You may get counseling or other support to help you quit.  Only take medicine as told by your doctor. Some medicines are safe and some are not during pregnancy.  Exercise only as told by your doctor. Stop exercising if you start having cramps.  Eat regular, healthy meals.  Wear a good support bra if your breasts are tender.  Do not use hot tubs, steam rooms, or saunas.  Wear your seat belt when driving.  Avoid raw meat, uncooked cheese, and liter boxes and soil used by cats.  Take your prenatal vitamins.  Take 1500-2000 milligrams of calcium daily starting at the 20th week of pregnancy until you deliver your baby.  Try taking medicine that helps you poop (stool softener) as needed, and if your doctor approves. Eat more fiber by eating fresh fruit, vegetables, and whole grains. Drink enough fluids to keep your pee (urine) clear or pale yellow.  Take warm water baths (sitz baths) to soothe pain or discomfort caused by hemorrhoids. Use hemorrhoid cream if your doctor approves.  If you have puffy, bulging veins (varicose veins), wear support hose. Raise (elevate) your feet for 15 minutes, 3-4 times a day. Limit salt in your diet.  Avoid heavy lifting, wear low heels, and sit up straight.  Rest with your legs raised if you have leg cramps or low back pain.  Visit your dentist if you have not gone during your pregnancy. Use a soft toothbrush to brush your teeth. Be gentle when you  floss.  You can have sex (intercourse) unless your doctor tells you not to.  Do not travel far distances unless you must. Only do so with your doctor's approval.  Take prenatal classes.  Practice driving to the hospital.  Pack your hospital bag.  Prepare the baby's room.  Go to your doctor visits. GET HELP IF:  You are not sure if you are in labor or if your water has broken.  You are dizzy.  You have mild cramps or pressure in your lower belly (abdominal).  You have a nagging pain in your belly area.  You continue to feel sick to your stomach (nauseous), throw up (vomit), or have watery poop (diarrhea).  You have bad smelling fluid coming from your vagina.  You have pain with peeing (urination). GET HELP RIGHT AWAY IF:   You have a fever.  You are leaking fluid from your vagina.  You are spotting or bleeding from your vagina.  You have severe belly cramping or pain.  You lose or gain weight rapidly.  You have trouble catching your breath and have chest pain.  You notice sudden or extreme puffiness (swelling) of your face, hands, ankles, feet, or legs.  You have not felt the baby move in over an hour.  You have severe headaches that do not go away with medicine.  You have vision changes.   This information is not intended to replace advice given to you by your health care   provider. Make sure you discuss any questions you have with your health care provider.   Document Released: 02/11/2010 Document Revised: 12/08/2014 Document Reviewed: 01/18/2013 Elsevier Interactive Patient Education 2016 [employer] Fetal Movement Counts Patient Name: __________________________________________________ Patient Due Date: ____________________ Performing a fetal movement count is highly recommended in high-risk pregnancies, but it is good for every pregnant woman to do. Your health care provider may ask you to start counting fetal movements at 28 weeks of the pregnancy.  Fetal movements often increase:  After eating a full meal.  After physical activity.  After eating or drinking something sweet or cold.  At rest. Pay attention to when you feel the baby is most active. This will help you notice a pattern of your baby's sleep and wake cycles and what factors contribute to an increase in fetal movement. It is important to perform a fetal movement count at the same time each day when your baby is normally most active.  HOW TO COUNT FETAL MOVEMENTS  Find a quiet and comfortable area to sit or lie down on your left side. Lying on your left side provides the best blood and oxygen circulation to your baby.  Write down the day and time on a sheet of paper or in a journal.  Start counting kicks, flutters, swishes, rolls, or jabs in a 2-hour period. You should feel at least 10 movements within 2 hours.  If you do not feel 10 movements in 2 hours, wait 2-3 hours and count again. Look for a change in the pattern or not enough counts in 2 hours. SEEK MEDICAL CARE IF:  You feel less than 10 counts in 2 hours, tried twice.  There is no movement in over an hour.  The pattern is changing or taking longer each day to reach 10 counts in 2 hours.  You feel the baby is not moving as he or she usually does. Date: ____________ Movements: ____________ Start time: ____________ Doreatha MartinFinish time: ____________  Date: ____________ Movements: ____________ Start time: ____________ Doreatha MartinFinish time: ____________ Date: ____________ Movements: ____________ Start time: ____________ Doreatha MartinFinish time: ____________ Date: ____________ Movements: ____________ Start time: ____________ Doreatha MartinFinish time: ____________ Date: ____________ Movements: ____________ Start time: ____________ Doreatha MartinFinish time: ____________ Date: ____________ Movements: ____________ Start time: ____________ Doreatha MartinFinish time: ____________ Date: ____________ Movements: ____________ Start time: ____________ Doreatha MartinFinish time: ____________ Date:  ____________ Movements: ____________ Start time: ____________ Doreatha MartinFinish time: ____________  Date: ____________ Movements: ____________ Start time: ____________ Doreatha MartinFinish time: ____________ Date: ____________ Movements: ____________ Start time: ____________ Doreatha MartinFinish time: ____________ Date: ____________ Movements: ____________ Start time: ____________ Doreatha MartinFinish time: ____________ Date: ____________ Movements: ____________ Start time: ____________ Doreatha MartinFinish time: ____________ Date: ____________ Movements: ____________ Start time: ____________ Doreatha MartinFinish time: ____________ Date: ____________ Movements: ____________ Start time: ____________ Doreatha MartinFinish time: ____________ Date: ____________ Movements: ____________ Start time: ____________ Doreatha MartinFinish time: ____________  Date: ____________ Movements: ____________ Start time: ____________ Doreatha MartinFinish time: ____________ Date: ____________ Movements: ____________ Start time: ____________ Doreatha MartinFinish time: ____________ Date: ____________ Movements: ____________ Start time: ____________ Doreatha MartinFinish time: ____________ Date: ____________ Movements: ____________ Start time: ____________ Doreatha MartinFinish time: ____________ Date: ____________ Movements: ____________ Start time: ____________ Doreatha MartinFinish time: ____________ Date: ____________ Movements: ____________ Start time: ____________ Doreatha MartinFinish time: ____________ Date: ____________ Movements: ____________ Start time: ____________ Doreatha MartinFinish time: ____________  Date: ____________ Movements: ____________ Start time: ____________ Doreatha MartinFinish time: ____________ Date: ____________ Movements: ____________ Start time: ____________ Doreatha MartinFinish time: ____________ Date: ____________ Movements: ____________ Start time: ____________ Doreatha MartinFinish time: ____________ Date: ____________ Movements: ____________ Start time: ____________ Doreatha MartinFinish time: ____________ Date: ____________ Movements: ____________ Start time: ____________  Finish time: ____________ Date: ____________ Movements: ____________ Start  time: ____________ Doreatha Martin time: ____________ Date: ____________ Movements: ____________ Start time: ____________ Doreatha Martin time: ____________  Date: ____________ Movements: ____________ Start time: ____________ Doreatha Martin time: ____________ Date: ____________ Movements: ____________ Start time: ____________ Doreatha Martin time: ____________ Date: ____________ Movements: ____________ Start time: ____________ Doreatha Martin time: ____________ Date: ____________ Movements: ____________ Start time: ____________ Doreatha Martin time: ____________ Date: ____________ Movements: ____________ Start time: ____________ Doreatha Martin time: ____________ Date: ____________ Movements: ____________ Start time: ____________ Doreatha Martin time: ____________ Date: ____________ Movements: ____________ Start time: ____________ Doreatha Martin time: ____________  Date: ____________ Movements: ____________ Start time: ____________ Doreatha Martin time: ____________ Date: ____________ Movements: ____________ Start time: ____________ Doreatha Martin time: ____________ Date: ____________ Movements: ____________ Start time: ____________ Doreatha Martin time: ____________ Date: ____________ Movements: ____________ Start time: ____________ Doreatha Martin time: ____________ Date: ____________ Movements: ____________ Start time: ____________ Doreatha Martin time: ____________ Date: ____________ Movements: ____________ Start time: ____________ Doreatha Martin time: ____________ Date: ____________ Movements: ____________ Start time: ____________ Doreatha Martin time: ____________  Date: ____________ Movements: ____________ Start time: ____________ Doreatha Martin time: ____________ Date: ____________ Movements: ____________ Start time: ____________ Doreatha Martin time: ____________ Date: ____________ Movements: ____________ Start time: ____________ Doreatha Martin time: ____________ Date: ____________ Movements: ____________ Start time: ____________ Doreatha Martin time: ____________ Date: ____________ Movements: ____________ Start time: ____________ Doreatha Martin time:  ____________ Date: ____________ Movements: ____________ Start time: ____________ Doreatha Martin time: ____________ Date: ____________ Movements: ____________ Start time: ____________ Doreatha Martin time: ____________  Date: ____________ Movements: ____________ Start time: ____________ Doreatha Martin time: ____________ Date: ____________ Movements: ____________ Start time: ____________ Doreatha Martin time: ____________ Date: ____________ Movements: ____________ Start time: ____________ Doreatha Martin time: ____________ Date: ____________ Movements: ____________ Start time: ____________ Doreatha Martin time: ____________ Date: ____________ Movements: ____________ Start time: ____________ Doreatha Martin time: ____________ Date: ____________ Movements: ____________ Start time: ____________ Doreatha Martin time: ____________   This information is not intended to replace advice given to you by your health care provider. Make sure you discuss any questions you have with your health care provider.   Document Released: 12/17/2006 Document Revised: 12/08/2014 Document Reviewed: 09/13/2012 Elsevier Interactive Patient Education [employer]

## 2016-05-14 NOTE — Progress Notes (Signed)
Pt thinks she has yeast infection..Marland Kitchen

## 2016-05-14 NOTE — Progress Notes (Signed)
  Subjective:  Dana Cantu is a 30 y.o. G1P0 at 43w3dbeing seen today for ongoing prenatal care.  She is currently monitored for the following issues for this low-risk pregnancy and has Supervision of normal pregnancy, antepartum on her problem list.  Patient reports vaginal discharge.  Contractions: Not present. Vag. Bleeding: None.  Movement: Present. Denies leaking of fluid.   The following portions of the patient's history were reviewed and updated as appropriate: allergies, current medications, past family history, past medical history, past social history, past surgical history and problem list. Problem list updated.  Objective:   Filed Vitals:   05/14/16 1002  BP: 105/66  Pulse: 101  Weight: 134 lb 4.8 oz (60.918 kg)    Fetal Status: Fetal Heart Rate (bpm): 145 Fundal Height: 29 cm Movement: Present     General:  Alert, oriented and cooperative. Patient is in no acute distress.  Skin: Skin is warm and dry. No rash noted.   Cardiovascular: Normal heart rate noted  Respiratory: Normal respiratory effort, no problems with respiration noted  Abdomen: Soft, gravid, appropriate for gestational age. Pain/Pressure: Present     Pelvic: Cervical exam deferred        Extremities: Normal range of motion.     Mental Status: Normal mood and affect. Normal behavior. Normal judgment and thought content.   Urinalysis: Urine Protein: 1+ Urine Glucose: Negative  Assessment and Plan:  Pregnancy: G1P0 at 246w3d1. Supervision of normal pregnancy, antepartum, third trimester - Glucose Tolerance, 1 HR (50g) - CBC - RPR - HIV antibody  2. Yeast vaginitis - Miconazole Nitrate Applicator 20681 2 MG-% (9GM) KIT; Place 1 Applicatorful vaginally at bedtime.  Dispense: 24 g; Refill: 0 - Miconazole Nitrate Applicator 20275 2 MG-% (9GM) KIT; Place 1 Applicatorful vaginally at bedtime.  Dispense: 24 g; Refill: 0  3. Asymptomatic bacteriuria during pregnancy in third trimester - UA with large  leukocytes - Culture, OB Urine ordered  Preterm labor symptoms and general obstetric precautions including but not limited to vaginal bleeding, contractions, leaking of fluid and fetal movement were reviewed in detail with the patient. Please refer to After Visit Summary for other counseling recommendations.  Return in about 2 weeks (around 05/28/2016) for LOB.   JuLuvenia ReddenPA-C

## 2016-05-15 ENCOUNTER — Other Ambulatory Visit: Payer: Self-pay | Admitting: Obstetrics & Gynecology

## 2016-05-15 DIAGNOSIS — B379 Candidiasis, unspecified: Secondary | ICD-10-CM

## 2016-05-15 LAB — HIV ANTIBODY (ROUTINE TESTING W REFLEX): HIV 1&2 Ab, 4th Generation: NONREACTIVE

## 2016-05-15 LAB — RPR

## 2016-05-15 MED ORDER — TERCONAZOLE 0.4 % VA CREA: 1.0000 | TOPICAL_CREAM | Freq: Every day | VAGINAL | Status: DC

## 2016-05-17 LAB — CULTURE, OB URINE

## 2016-05-17 LAB — GLUCOSE TOLERANCE, 1 HOUR (50G) W/O FASTING: Glucose, 1 Hr, gestational: 98 mg/dL (ref <140)

## 2016-05-27 ENCOUNTER — Ambulatory Visit (INDEPENDENT_AMBULATORY_CARE_PROVIDER_SITE_OTHER): Payer: Medicaid Other | Admitting: Student

## 2016-05-27 VITALS — BP 113/68 | HR 105 | Wt 138.2 lb

## 2016-05-27 DIAGNOSIS — Z3483 Encounter for supervision of other normal pregnancy, third trimester: Secondary | ICD-10-CM

## 2016-05-27 LAB — POCT URINALYSIS DIP (DEVICE)
BILIRUBIN URINE: NEGATIVE
GLUCOSE, UA: NEGATIVE mg/dL
Hgb urine dipstick: NEGATIVE
Ketones, ur: NEGATIVE mg/dL
NITRITE: NEGATIVE
Protein, ur: NEGATIVE mg/dL
Specific Gravity, Urine: 1.025 (ref 1.005–1.030)
UROBILINOGEN UA: 0.2 mg/dL (ref 0.0–1.0)
pH: 6.5 (ref 5.0–8.0)

## 2016-05-27 NOTE — Progress Notes (Signed)
Subjective:  Dana Cantu is a 30 y.o. G1P0 at 7759w2d being seen today for ongoing prenatal care.  She is currently monitored for the following issues for this low-risk pregnancy and has Supervision of normal pregnancy, antepartum on her problem list.  Patient reports no bleeding, no leaking and occasional contractions. Vag. Bleeding: None.  Movement: Present. Denies leaking of fluid.   The following portions of the patient's history were reviewed and updated as appropriate: allergies, current medications, past family history, past medical history, past social history, past surgical history and problem list. Problem list updated.  Objective:   Filed Vitals:   05/27/16 0840  BP: 113/68  Pulse: 105  Weight: 138 lb 3.2 oz (62.687 kg)    Fetal Status: Fetal Heart Rate (bpm): 137 Fundal Height: 33 cm Movement: Present     General:  Alert, oriented and cooperative. Patient is in no acute distress.  Skin: Skin is warm and dry. No rash noted.   Cardiovascular: Normal heart rate noted  Respiratory: Normal respiratory effort, no problems with respiration noted  Abdomen: Soft, gravid, appropriate for gestational age. Pain/Pressure: Present     Pelvic: Cervical exam deferred        Extremities: Normal range of motion.     Mental Status: Normal mood and affect. Normal behavior. Normal judgment and thought content.   Urinalysis:      Assessment and Plan:  Pregnancy: G1P0 at 159w2d  1. Supervision of normal pregnancy, antepartum, third trimester - Continue routine antepartum care - Follow up in two weeks  Preterm labor symptoms and general obstetric precautions including but not limited to vaginal bleeding, contractions, leaking of fluid and fetal movement were reviewed in detail with the patient. Please refer to After Visit Summary for other counseling recommendations.  Return in about 2 weeks (around 06/10/2016) for Routine OB.   Judeth HornErin Ashton Sabine, NP

## 2016-05-27 NOTE — Patient Instructions (Signed)
Preterm Labor Information °Preterm labor is when labor starts before you are [redacted] weeks pregnant. The normal length of pregnancy is 39 to 41 weeks.  °CAUSES  °The cause of preterm labor is not often known. The most common known cause is infection. °RISK FACTORS °· Having a history of preterm labor. °· Having your water break before it should. °· Having a placenta that covers the opening of the cervix. °· Having a placenta that breaks away from the uterus. °· Having a cervix that is too weak to hold the baby in the uterus. °· Having too much fluid in the amniotic sac. °· Taking drugs or smoking while pregnant. °· Not gaining enough weight while pregnant. °· Being younger than 18 and older than 29 years old. °· Having a low income. °· Being African American. °SYMPTOMS °· Period-like cramps, belly (abdominal) pain, or back pain. °· Contractions that are regular, as often as six in an hour. They may be mild or painful. °· Contractions that start at the top of the belly. They then move to the lower belly and back. °· Lower belly pressure that seems to get stronger. °· Bleeding from the vagina. °· Fluid leaking from the vagina. °TREATMENT  °Treatment depends on: °· Your condition. °· The condition of your baby. °· How many weeks pregnant you are. °Your doctor may have you: °· Take medicine to stop contractions. °· Stay in bed except to use the restroom (bed rest). °· Stay in the hospital. °WHAT SHOULD YOU DO IF YOU THINK YOU ARE IN PRETERM LABOR? °Call your doctor right away. You need to go to the hospital right away.  °HOW CAN YOU PREVENT PRETERM LABOR IN FUTURE PREGNANCIES? °· Stop smoking, if you smoke. °· Maintain healthy weight gain. °· Do not take drugs or be around chemicals that are not needed. °· Tell your doctor if you think you have an infection. °· Tell your doctor if you had a preterm labor before. °  °This information is not intended to replace advice given to you by your health care provider. Make sure you  discuss any questions you have with your health care provider. °  °Document Released: 02/13/2009 Document Revised: 04/03/2015 Document Reviewed: 12/20/2012 °Elsevier Interactive Patient Education ©2016 [employer] ° °

## 2016-05-28 ENCOUNTER — Encounter (HOSPITAL_COMMUNITY): Payer: Self-pay | Admitting: *Deleted

## 2016-05-28 ENCOUNTER — Inpatient Hospital Stay (HOSPITAL_COMMUNITY)
Admission: AD | Admit: 2016-05-28 | Discharge: 2016-05-28 | Disposition: A | Discharge status: Home / Self Care | Payer: Medicaid Other | Source: Ambulatory Visit | Attending: Obstetrics and Gynecology | Admitting: Obstetrics and Gynecology

## 2016-05-28 DIAGNOSIS — M549 Dorsalgia, unspecified: Secondary | ICD-10-CM | POA: Insufficient documentation

## 2016-05-28 DIAGNOSIS — O26893 Other specified pregnancy related conditions, third trimester: Secondary | ICD-10-CM | POA: Diagnosis not present

## 2016-05-28 DIAGNOSIS — R1030 Lower abdominal pain, unspecified: Secondary | ICD-10-CM | POA: Diagnosis not present

## 2016-05-28 DIAGNOSIS — Z3A30 30 weeks gestation of pregnancy: Secondary | ICD-10-CM | POA: Diagnosis not present

## 2016-05-28 DIAGNOSIS — O4703 False labor before 37 completed weeks of gestation, third trimester: Secondary | ICD-10-CM

## 2016-05-28 DIAGNOSIS — O479 False labor, unspecified: Secondary | ICD-10-CM

## 2016-05-28 DIAGNOSIS — R109 Unspecified abdominal pain: Secondary | ICD-10-CM | POA: Diagnosis present

## 2016-05-28 DIAGNOSIS — Z3482 Encounter for supervision of other normal pregnancy, second trimester: Secondary | ICD-10-CM

## 2016-05-28 HISTORY — DX: Other specified health status: Z78.9

## 2016-05-28 LAB — URINE MICROSCOPIC-ADD ON: RBC / HPF: NONE SEEN RBC/hpf (ref 0–5)

## 2016-05-28 LAB — URINALYSIS, ROUTINE W REFLEX MICROSCOPIC
Bilirubin Urine: NEGATIVE
Glucose, UA: NEGATIVE mg/dL
HGB URINE DIPSTICK: NEGATIVE
Ketones, ur: NEGATIVE mg/dL
Nitrite: NEGATIVE
Protein, ur: NEGATIVE mg/dL
SPECIFIC GRAVITY, URINE: 1.01 (ref 1.005–1.030)
pH: 6.5 (ref 5.0–8.0)

## 2016-05-28 NOTE — Discharge Instructions (Signed)
Braxton Hicks Contractions °Contractions of the uterus can occur throughout pregnancy. Contractions are not always a sign that you are in labor.  °WHAT ARE BRAXTON HICKS CONTRACTIONS?  °Contractions that occur before labor are called Braxton Hicks contractions, or false labor. Toward the end of pregnancy (32-34 weeks), these contractions can develop more often and may become more forceful. This is not true labor because these contractions do not result in opening (dilatation) and thinning of the cervix. They are sometimes difficult to tell apart from true labor because these contractions can be forceful and people have different pain tolerances. You should not feel embarrassed if you go to the hospital with false labor. Sometimes, the only way to tell if you are in true labor is for your health care provider to look for changes in the cervix. °If there are no prenatal problems or other health problems associated with the pregnancy, it is completely safe to be sent home with false labor and await the onset of true labor. °HOW CAN YOU TELL THE DIFFERENCE BETWEEN TRUE AND FALSE LABOR? °False Labor °· The contractions of false labor are usually shorter and not as hard as those of true labor.   °· The contractions are usually irregular.   °· The contractions are often felt in the front of the lower abdomen and in the groin.   °· The contractions may go away when you walk around or change positions while lying down.   °· The contractions get weaker and are shorter lasting as time goes on.   °· The contractions do not usually become progressively stronger, regular, and closer together as with true labor.   °True Labor °· Contractions in true labor last 30-70 seconds, become very regular, usually become more intense, and increase in frequency.   °· The contractions do not go away with walking.   °· The discomfort is usually felt in the top of the uterus and spreads to the lower abdomen and low back.   °· True labor can be  determined by your health care provider with an exam. This will show that the cervix is dilating and getting thinner.   °WHAT TO REMEMBER °· Keep up with your usual exercises and follow other instructions given by your health care provider.   °· Take medicines as directed by your health care provider.   °· Keep your regular prenatal appointments.   °· Eat and drink lightly if you think you are going into labor.   °· If Braxton Hicks contractions are making you uncomfortable:   °¨ Change your position from lying down or resting to walking, or from walking to resting.   °¨ Sit and rest in a tub of warm water.   °¨ Drink 2-3 glasses of water. Dehydration may cause these contractions.   °¨ Do slow and deep breathing several times an hour.   °WHEN SHOULD I SEEK IMMEDIATE MEDICAL CARE? °Seek immediate medical care if: °· Your contractions become stronger, more regular, and closer together.   °· You have fluid leaking or gushing from your vagina.   °· You have a fever.   °· You pass blood-tinged mucus.   °· You have vaginal bleeding.   °· You have continuous abdominal pain.   °· You have low back pain that you never had before.   °· You feel your baby's head pushing down and causing pelvic pressure.   °· Your baby is not moving as much as it used to.   °  °This information is not intended to replace advice given to you by your health care provider. Make sure you discuss any questions you have with your health care   provider. °  °Document Released: 11/17/2005 Document Revised: 11/22/2013 Document Reviewed: 08/29/2013 °Elsevier Interactive Patient Education ©2016 Else[employer] Inc[employer] ° °Preterm Labor Information °Preterm labor is when labor starts at less than 37 weeks of pregnancy. The normal length of a pregnancy is 39 to 41 weeks. °CAUSES °Often, there is no identifiable underlying cause as to why a woman goes into preterm labor. One of the most common known causes of preterm labor is infection. Infections of the uterus, cervix,  vagina, amniotic sac, bladder, kidney, or even the lungs (pneumonia) can cause labor to start. Other suspected causes of preterm labor include:  °· Urogenital infections, such as yeast infections and bacterial vaginosis.   °· Uterine abnormalities (uterine shape, uterine septum, fibroids, or bleeding from the placenta).   °· A cervix that has been operated on (it may fail to stay closed).   °· Malformations in the fetus.   °· Multiple gestations (twins, triplets, and so on).   °· Breakage of the amniotic sac.   °RISK FACTORS °· Having a previous history of preterm labor.   °· Having premature rupture of membranes (PROM).   °· Having a placenta that covers the opening of the cervix (placenta previa).   °· Having a placenta that separates from the uterus (placental abruption).   °· Having a cervix that is too weak to hold the fetus in the uterus (incompetent cervix).   °· Having too much fluid in the amniotic sac (polyhydramnios).   °· Taking illegal drugs or smoking while pregnant.   °· Not gaining enough weight while pregnant.   °· Being younger than 18 and older than 29 years old.   °· Having a low socioeconomic status.   °· Being African American. °SYMPTOMS °Signs and symptoms of preterm labor include:  °· Menstrual-like cramps, abdominal pain, or back pain. °· Uterine contractions that are regular, as frequent as six in an hour, regardless of their intensity (may be mild or painful). °· Contractions that start on the top of the uterus and spread down to the lower abdomen and back.   °· A sense of increased pelvic pressure.   °· A watery or bloody mucus discharge that comes from the vagina.   °TREATMENT °Depending on the length of the pregnancy and other circumstances, your health care provider may suggest bed rest. If necessary, there are medicines that can be given to stop contractions and to mature the fetal lungs. If labor happens before 34 weeks of pregnancy, a prolonged hospital stay may be recommended.  Treatment depends on the condition of both you and the fetus.  °WHAT SHOULD YOU DO IF YOU THINK YOU ARE IN PRETERM LABOR? °Call your health care provider right away. You will need to go to the hospital to get checked immediately. °HOW CAN YOU PREVENT PRETERM LABOR IN FUTURE PREGNANCIES? °You should:  °· Stop smoking if you smoke.  °· Maintain healthy weight gain and avoid chemicals and drugs that are not necessary. °· Be watchful for any type of infection. °· Inform your health care provider if you have a known history of preterm labor. °  °This information is not intended to replace advice given to you by your health care provider. Make sure you discuss any questions you have with your health care provider. °  °Document Released: 02/07/2004 Document Revised: 07/20/2013 Document Reviewed: 12/20/2012 °Elsevier Interactive Patient Education ©2016 Elsevier Inc. ° °

## 2016-05-28 NOTE — MAU Note (Signed)
C/o back and lower abdominal pain since last night @ 2300; denies vaginal leaking or vaginal spotting;

## 2016-05-28 NOTE — MAU Provider Note (Signed)
MAU HISTORY AND PHYSICAL  Chief Complaint:  Back Pain and Abdominal Pain   Gaylin F Lucking is a 30 y.o.  G1P0 with IUP at 8552w3d presenting for Back Pain and Abdominal Pain Prenatal risk factors: nullipara Pt states that last night had a sudden onset of lower back pain and "pulling on side towards vagina".  These symptoms subsided and pt was able to sleep.  However today woke up and pain presented again, could not tolerate staying at work.  Came to MAU for evaluation.  Last intercourse: >72 hrs.  Pt denies any urinary symptoms, n/v, fever, chills, HA, changes in vision, CP, SOB, RUQ pain, swelling. Patient states she has been having  irregular, every 10-30 minutes contractions, none vaginal bleeding, intact membranes, with active fetal movement.    Past Medical History  Diagnosis Date  . Medical history non-contributory     Past Surgical History  Procedure Laterality Date  . Tonsillectomy      Family History  Problem Relation Age of Onset  . Diabetes Father     Social History  Substance Use Topics  . Smoking status: Never Smoker   . Smokeless tobacco: None  . Alcohol Use: No    No Known Allergies  Prescriptions prior to admission  Medication Sig Dispense Refill Last Dose  . loratadine (CLARITIN) 10 MG tablet Take 10 mg by mouth daily as needed for allergies.   Past Week at Unknown time  . terconazole (TERAZOL 7) 0.4 % vaginal cream Place 1 applicator vaginally at bedtime. Use for seven days 45 g 0 05/26/2016    Review of Systems - Negative except for what is mentioned in HPI.  Physical Exam  Blood pressure 113/68, pulse 100, temperature 98.4 F (36.9 C), resp. rate 18, last menstrual period 10/27/2015. GENERAL: Well-developed, well-nourished female in no acute distress.  LUNGS: Clear to auscultation bilaterally.  HEART: Regular rate and rhythm. ABDOMEN: Soft, nontender, nondistended, gravid.  EXTREMITIES: Nontender, no edema, 2+ distal pulses. Cervical Exam:  Closed/thick/posterior Presentation: cephalic FHT:  Baseline 140, mod variability, +acels, no decels Contractions: Mild irritability   Labs: No results found for this or any previous visit (from the past 24 hour(s)).  Imaging Studies:  No results found.  Assessment: Samuel JesterLuul F Sutphen is  30 y.o. G1P0 at 2752w3d presents with Back Pain and Abdominal Pain .  Plan: #Labor check -UA negative -Fetal fibronectin collected- but not sent due to cervix being closed/thick/high -Cervical exam shows closed cervix with no changes despite reported contractions -D/c home with pre-term labor precautions, follow up with Ob provider as scheduled  Olena LeatherwoodKelly M Aguilar 6/28/201711:51 AM  OB fellow attestation:  I have seen and examined this patient; I agree with above documentation in the resident's note.   Keyleigh F Karow is a 30 y.o. G1P0 reporting concern for PTL with lower abdominal pain and back pain.  +FM, denies LOF, VB, contractions, vaginal discharge.  PE: BP 114/72 mmHg  Pulse 97  Temp(Src) 98.4 F (36.9 C)  Resp 16  LMP 10/27/2015 Gen: calm comfortable, NAD Resp: normal effort, no distress Abd: gravid  ROS, labs, PMH reviewed NST reactive   Plan: - fetal kick counts reinforced, preterm labor precautions - Discussed possible round ligament pain - continue routine follow up in OB clinic  Federico FlakeKimberly Niles Khanh Tanori, MD , MPH, ABFM Family Medicine, OB Fellow [facility]

## 2016-06-10 ENCOUNTER — Ambulatory Visit (INDEPENDENT_AMBULATORY_CARE_PROVIDER_SITE_OTHER): Payer: Medicaid Other | Admitting: Advanced Practice Midwife

## 2016-06-10 VITALS — BP 114/70 | HR 109 | Temp 98.0°F | Wt 138.9 lb

## 2016-06-10 DIAGNOSIS — Z3483 Encounter for supervision of other normal pregnancy, third trimester: Secondary | ICD-10-CM

## 2016-06-10 LAB — POCT URINALYSIS DIP (DEVICE)
BILIRUBIN URINE: NEGATIVE
Glucose, UA: NEGATIVE mg/dL
Hgb urine dipstick: NEGATIVE
NITRITE: NEGATIVE
Protein, ur: 100 mg/dL — AB
Specific Gravity, Urine: 1.02 (ref 1.005–1.030)
Urobilinogen, UA: 1 mg/dL (ref 0.0–1.0)
pH: 7.5 (ref 5.0–8.0)

## 2016-06-10 NOTE — Progress Notes (Signed)
Pt c/o of not being able to sleep for the past two days.

## 2016-06-10 NOTE — Progress Notes (Signed)
Subjective:  Dana Cantu is a [age] y.o. G1P0 at 1560w2d being seen today for ongoing prenatal care.  She is currently monitored for the following issues for this low-risk pregnancy and has Supervision of normal pregnancy, antepartum on her problem list.  Patient reports trouble slleping due to racing mind. She is worried about all she has to do before the baby comes she is not complaining of headache, swelling or dizziness .  Contractions: Irregular. Vag. Bleeding: None.  Movement: Present. Denies leaking of fluid.   The following portions of the patient's history were reviewed and updated as appropriate: allergies, current medications, past family history, past medical history, past social history, past surgical history and problem list. Problem list updated.  Objective:   Filed Vitals:   06/10/16 0927  BP: 114/70  Pulse: 109  Temp: 98 F (36.7 C)  Weight: 138 lb 14.4 oz (63.005 kg)    Fetal Status: Fetal Heart Rate (bpm): 133   Movement: Present     General:  Alert, oriented and cooperative. Patient is in no acute distress.  Skin: Skin is warm and dry. No rash noted.   Cardiovascular: Normal heart rate noted  Respiratory: Normal respiratory effort, no problems with respiration noted  Abdomen: Soft, gravid, appropriate for gestational age. Pain/Pressure: Present     Pelvic:  Cervical exam deferred        Extremities: Normal range of motion.  Edema: Trace  Mental Status: Normal mood and affect. Normal behavior. Normal judgment and thought content.   Urinalysis: Urine Protein: 1+ Urine Glucose: Negative  Assessment and Plan:  Pregnancy: G1P0 at 2460w2d  1. Supervision of normal pregnancy, antepartum, third trimester  -discussed potential peditirician -declined Tdap today, discussed risks including death of infant due to whooping cough.  Preterm labor symptoms and general obstetric precautions including but not limited to vaginal bleeding, contractions, leaking of fluid and fetal  movement were reviewed in detail with the patient. Please refer to After Visit Summary for other counseling recommendations.  Return in about 2 weeks (around 06/24/2016).   Dorathy KinsmanVirginia Alexsandra Cantu, CNM

## 2016-06-10 NOTE — Patient Instructions (Signed)
Braxton Hicks Contractions °Contractions of the uterus can occur throughout pregnancy. Contractions are not always a sign that you are in labor.  °WHAT ARE BRAXTON HICKS CONTRACTIONS?  °Contractions that occur before labor are called Braxton Hicks contractions, or false labor. Toward the end of pregnancy (32-34 weeks), these contractions can develop more often and may become more forceful. This is not true labor because these contractions do not result in opening (dilatation) and thinning of the cervix. They are sometimes difficult to tell apart from true labor because these contractions can be forceful and people have different pain tolerances. You should not feel embarrassed if you go to the hospital with false labor. Sometimes, the only way to tell if you are in true labor is for your health care provider to look for changes in the cervix. °If there are no prenatal problems or other health problems associated with the pregnancy, it is completely safe to be sent home with false labor and await the onset of true labor. °HOW CAN YOU TELL THE DIFFERENCE BETWEEN TRUE AND FALSE LABOR? °False Labor °· The contractions of false labor are usually shorter and not as hard as those of true labor.   °· The contractions are usually irregular.   °· The contractions are often felt in the front of the lower abdomen and in the groin.   °· The contractions may go away when you walk around or change positions while lying down.   °· The contractions get weaker and are shorter lasting as time goes on.   °· The contractions do not usually become progressively stronger, regular, and closer together as with true labor.   °True Labor °· Contractions in true labor last 30-70 seconds, become very regular, usually become more intense, and increase in frequency.   °· The contractions do not go away with walking.   °· The discomfort is usually felt in the top of the uterus and spreads to the lower abdomen and low back.   °· True labor can be  determined by your health care provider with an exam. This will show that the cervix is dilating and getting thinner.   °WHAT TO REMEMBER °· Keep up with your usual exercises and follow other instructions given by your health care provider.   °· Take medicines as directed by your health care provider.   °· Keep your regular prenatal appointments.   °· Eat and drink lightly if you think you are going into labor.   °· If Braxton Hicks contractions are making you uncomfortable:   °¨ Change your position from lying down or resting to walking, or from walking to resting.   °¨ Sit and rest in a tub of warm water.   °¨ Drink 2-3 glasses of water. Dehydration may cause these contractions.   °¨ Do slow and deep breathing several times an hour.   °WHEN SHOULD I SEEK IMMEDIATE MEDICAL CARE? °Seek immediate medical care if: °· Your contractions become stronger, more regular, and closer together.   °· You have fluid leaking or gushing from your vagina.   °· You have a fever.   °· You pass blood-tinged mucus.   °· You have vaginal bleeding.   °· You have continuous abdominal pain.   °· You have low back pain that you never had before.   °· You feel your baby's head pushing down and causing pelvic pressure.   °· Your baby is not moving as much as it used to.   °  °This information is not intended to replace advice given to you by your health care provider. Make sure you discuss any questions you have with your health care   provider. °  °Document Released: 11/17/2005 Document Revised: 11/22/2013 Document Reviewed: 08/29/2013 °Elsevier Interactive Patient Education ©2016 Elsevier Inc. ° °

## 2016-06-24 ENCOUNTER — Ambulatory Visit (INDEPENDENT_AMBULATORY_CARE_PROVIDER_SITE_OTHER): Payer: Medicaid Other | Admitting: Student

## 2016-06-24 VITALS — BP 110/76 | HR 90 | Wt 143.4 lb

## 2016-06-24 DIAGNOSIS — Z3483 Encounter for supervision of other normal pregnancy, third trimester: Secondary | ICD-10-CM

## 2016-06-24 LAB — POCT URINALYSIS DIP (DEVICE)
Bilirubin Urine: NEGATIVE
Glucose, UA: NEGATIVE mg/dL
Ketones, ur: NEGATIVE mg/dL
Nitrite: NEGATIVE
Protein, ur: NEGATIVE mg/dL
Specific Gravity, Urine: 1.015 (ref 1.005–1.030)
Urobilinogen, UA: 0.2 mg/dL (ref 0.0–1.0)
pH: 7 (ref 5.0–8.0)

## 2016-06-24 NOTE — Patient Instructions (Signed)

## 2016-06-24 NOTE — Progress Notes (Signed)
Subjective:  Dana Cantu is a 30 y.o. G1P0 at [redacted]w[redacted]d being seen today for ongoing prenatal care.  She is currently monitored for the following issues for this low-risk pregnancy and has Supervision of normal pregnancy, antepartum on her problem list.  Patient reports no complaints.  Contractions: Not present. Vag. Bleeding: None.  Movement: Present. Denies leaking of fluid.   The following portions of the patient's history were reviewed and updated as appropriate: allergies, current medications, past family history, past medical history, past social history, past surgical history and problem list. Problem list updated.  Objective:   Vitals:   06/24/16 1134  BP: 110/76  Pulse: 90  Weight: 143 lb 6.4 oz (65 kg)    Fetal Status: Fetal Heart Rate (bpm): 134 Fundal Height: 35 cm Movement: Present     General:  Alert, oriented and cooperative. Patient is in no acute distress.  Skin: Skin is warm and dry. No rash noted.   Cardiovascular: Normal heart rate noted  Respiratory: Normal respiratory effort, no problems with respiration noted  Abdomen: Soft, gravid, appropriate for gestational age. Pain/Pressure: Present     Pelvic:  Cervical exam deferred        Extremities: Normal range of motion.     Mental Status: Normal mood and affect. Normal behavior. Normal judgment and thought content.   Urinalysis:      Assessment and Plan:  Pregnancy: G1P0 at [redacted]w[redacted]d  1. Supervision of normal pregnancy, antepartum, third trimester   Preterm labor symptoms and general obstetric precautions including but not limited to vaginal bleeding, contractions, leaking of fluid and fetal movement were reviewed in detail with the patient. Please refer to After Visit Summary for other counseling recommendations.  Return in about 2 weeks (around 07/08/2016) for Routine OB.   Judeth Horn, NP

## 2016-07-07 ENCOUNTER — Ambulatory Visit (INDEPENDENT_AMBULATORY_CARE_PROVIDER_SITE_OTHER): Payer: Medicaid Other | Admitting: Obstetrics & Gynecology

## 2016-07-07 ENCOUNTER — Other Ambulatory Visit (HOSPITAL_COMMUNITY)
Admission: RE | Admit: 2016-07-07 | Discharge: 2016-07-07 | Disposition: A | Discharge status: Home / Self Care | Payer: Medicaid Other | Source: Ambulatory Visit | Attending: Obstetrics & Gynecology | Admitting: Obstetrics & Gynecology

## 2016-07-07 VITALS — BP 108/73 | HR 101 | Wt 145.1 lb

## 2016-07-07 DIAGNOSIS — Z3483 Encounter for supervision of other normal pregnancy, third trimester: Secondary | ICD-10-CM

## 2016-07-07 DIAGNOSIS — Z113 Encounter for screening for infections with a predominantly sexual mode of transmission: Secondary | ICD-10-CM | POA: Diagnosis not present

## 2016-07-07 DIAGNOSIS — Z3493 Encounter for supervision of normal pregnancy, unspecified, third trimester: Secondary | ICD-10-CM

## 2016-07-07 LAB — POCT URINALYSIS DIP (DEVICE)
Bilirubin Urine: NEGATIVE
GLUCOSE, UA: NEGATIVE mg/dL
KETONES UR: NEGATIVE mg/dL
Nitrite: NEGATIVE
PH: 6 (ref 5.0–8.0)
PROTEIN: NEGATIVE mg/dL
SPECIFIC GRAVITY, URINE: 1.015 (ref 1.005–1.030)
UROBILINOGEN UA: 0.2 mg/dL (ref 0.0–1.0)

## 2016-07-07 LAB — OB RESULTS CONSOLE GC/CHLAMYDIA: Gonorrhea: NEGATIVE

## 2016-07-07 LAB — OB RESULTS CONSOLE GBS: GBS: NEGATIVE

## 2016-07-07 NOTE — Progress Notes (Signed)
Subjective:  Dana Cantu is a 30 y.o. G1P0 at 45103w1d being seen today for ongoing prenatal care.  She is currently monitored for the following issues for this low-risk pregnancy and has Supervision of normal pregnancy, antepartum on her problem list.  Patient reports no complaints.  Contractions: Irregular. Vag. Bleeding: None.  Movement: Present. Denies leaking of fluid.   The following portions of the patient's history were reviewed and updated as appropriate: allergies, current medications, past family history, past medical history, past social history, past surgical history and problem list. Problem list updated.  Objective:   Vitals:   07/07/16 1544  BP: 108/73  Pulse: (!) 101  Weight: 145 lb 1.6 oz (65.8 kg)    Fetal Status: Fetal Heart Rate (bpm): 140   Movement: Present     General:  Alert, oriented and cooperative. Patient is in no acute distress.  Skin: Skin is warm and dry. No rash noted.   Cardiovascular: Normal heart rate noted  Respiratory: Normal respiratory effort, no problems with respiration noted  Abdomen: Soft, gravid, appropriate for gestational age. Pain/Pressure: Present     Pelvic:  Cervical exam performed        Extremities: Normal range of motion.  Edema: Trace  Mental Status: Normal mood and affect. Normal behavior. Normal judgment and thought content.   Urinalysis:      Assessment and Plan:  Pregnancy: G1P0 at 96103w1d  1. Supervision of normal pregnancy, antepartum, third trimester  - [facility]lam[facility] - Culture, beta strep (group b only) - POCT urinalysis dip (device)  Preterm labor symptoms and general obstetric precautions including but not limited to vaginal bleeding, contractions, leaking of fluid and fetal movement were reviewed in detail with the patient. Please refer to After Visit Summary for other counseling recommendations.  1 week f/u  Adam PhenixJames G Man Effertz, MD

## 2016-07-07 NOTE — Patient Instructions (Signed)

## 2016-07-07 NOTE — Progress Notes (Signed)
C/o greenish discharge last night.

## 2016-07-08 LAB — GC/CHLAMYDIA PROBE AMP (~~LOC~~) NOT AT ARMC
CHLAMYDIA, DNA PROBE: NEGATIVE
Neisseria Gonorrhea: NEGATIVE

## 2016-07-11 LAB — CULTURE, BETA STREP (GROUP B ONLY)

## 2016-07-17 ENCOUNTER — Encounter: Payer: Self-pay | Admitting: Family Medicine

## 2016-07-22 ENCOUNTER — Ambulatory Visit (INDEPENDENT_AMBULATORY_CARE_PROVIDER_SITE_OTHER): Payer: Medicaid Other | Admitting: Medical

## 2016-07-22 VITALS — BP 120/81 | HR 108 | Wt 151.0 lb

## 2016-07-22 DIAGNOSIS — Z3483 Encounter for supervision of other normal pregnancy, third trimester: Secondary | ICD-10-CM

## 2016-07-22 LAB — POCT URINALYSIS DIP (DEVICE)
Bilirubin Urine: NEGATIVE
GLUCOSE, UA: NEGATIVE mg/dL
Hgb urine dipstick: NEGATIVE
Ketones, ur: NEGATIVE mg/dL
NITRITE: NEGATIVE
PROTEIN: 30 mg/dL — AB
SPECIFIC GRAVITY, URINE: 1.025 (ref 1.005–1.030)
UROBILINOGEN UA: 0.2 mg/dL (ref 0.0–1.0)
pH: 6 (ref 5.0–8.0)

## 2016-07-22 NOTE — Progress Notes (Signed)
Subjective:  Dana Cantu is a 30 y.o. G1P0 at 123w2d being seen today for ongoing prenatal care.  She is currently monitored for the following issues for this low-risk pregnancy and has Supervision of normal pregnancy, antepartum on her problem list.  Patient reports no complaints.  Contractions: Irregular. Vag. Bleeding: None.  Movement: Present. Denies leaking of fluid.   The following portions of the patient's history were reviewed and updated as appropriate: allergies, current medications, past family history, past medical history, past social history, past surgical history and problem list. Problem list updated.  Objective:   Vitals:   07/22/16 1133  BP: 120/81  Pulse: (!) 108  Weight: 151 lb (68.5 kg)    Fetal Status: Fetal Heart Rate (bpm): 145 Fundal Height: 39 cm Movement: Present  Presentation: Vertex  General:  Alert, oriented and cooperative. Patient is in no acute distress.  Skin: Skin is warm and dry. No rash noted.   Cardiovascular: Normal heart rate noted  Respiratory: Normal respiratory effort, no problems with respiration noted  Abdomen: Soft, gravid, appropriate for gestational age. Pain/Pressure: Absent     Pelvic:  Cervical exam performed Dilation: Closed Effacement (%): 50 Station: -3  Extremities: Normal range of motion.  Edema: Trace  Mental Status: Normal mood and affect. Normal behavior. Normal judgment and thought content.   Urinalysis: Urine Protein: 1+ Urine Glucose: Negative  Assessment and Plan:  Pregnancy: G1P0 at 183w2d  1. Supervision of normal pregnancy, antepartum, third trimester - GBS negative at last visit, patient informed - Mild non-pitting edema of both feet, advised of warning signs for pre-eclampsia and encouraged elevation and rest  Term labor symptoms and general obstetric precautions including but not limited to vaginal bleeding, contractions, leaking of fluid and fetal movement were reviewed in detail with the patient. Please refer  to After Visit Summary for other counseling recommendations.  Return in about 1 week (around 07/29/2016).   Marny LowensteinJulie N Nichele Slawson, PA-C

## 2016-07-22 NOTE — Patient Instructions (Signed)
Braxton Hicks Contractions °Contractions of the uterus can occur throughout pregnancy. Contractions are not always a sign that you are in labor.  °WHAT ARE BRAXTON HICKS CONTRACTIONS?  °Contractions that occur before labor are called Braxton Hicks contractions, or false labor. Toward the end of pregnancy (32-34 weeks), these contractions can develop more often and may become more forceful. This is not true labor because these contractions do not result in opening (dilatation) and thinning of the cervix. They are sometimes difficult to tell apart from true labor because these contractions can be forceful and people have different pain tolerances. You should not feel embarrassed if you go to the hospital with false labor. Sometimes, the only way to tell if you are in true labor is for your health care provider to look for changes in the cervix. °If there are no prenatal problems or other health problems associated with the pregnancy, it is completely safe to be sent home with false labor and await the onset of true labor. °HOW CAN YOU TELL THE DIFFERENCE BETWEEN TRUE AND FALSE LABOR? °False Labor °· The contractions of false labor are usually shorter and not as hard as those of true labor.   °· The contractions are usually irregular.   °· The contractions are often felt in the front of the lower abdomen and in the groin.   °· The contractions may go away when you walk around or change positions while lying down.   °· The contractions get weaker and are shorter lasting as time goes on.   °· The contractions do not usually become progressively stronger, regular, and closer together as with true labor.   °True Labor °· Contractions in true labor last 30-70 seconds, become very regular, usually become more intense, and increase in frequency.   °· The contractions do not go away with walking.   °· The discomfort is usually felt in the top of the uterus and spreads to the lower abdomen and low back.   °· True labor can be  determined by your health care provider with an exam. This will show that the cervix is dilating and getting thinner.   °WHAT TO REMEMBER °· Keep up with your usual exercises and follow other instructions given by your health care provider.   °· Take medicines as directed by your health care provider.   °· Keep your regular prenatal appointments.   °· Eat and drink lightly if you think you are going into labor.   °· If Braxton Hicks contractions are making you uncomfortable:   °¨ Change your position from lying down or resting to walking, or from walking to resting.   °¨ Sit and rest in a tub of warm water.   °¨ Drink 2-3 glasses of water. Dehydration may cause these contractions.   °¨ Do slow and deep breathing several times an hour.   °WHEN SHOULD I SEEK IMMEDIATE MEDICAL CARE? °Seek immediate medical care if: °· Your contractions become stronger, more regular, and closer together.   °· You have fluid leaking or gushing from your vagina.   °· You have a fever.   °· You pass blood-tinged mucus.   °· You have vaginal bleeding.   °· You have continuous abdominal pain.   °· You have low back pain that you never had before.   °· You feel your baby's head pushing down and causing pelvic pressure.   °· Your baby is not moving as much as it used to.   °  °This information is not intended to replace advice given to you by your health care provider. Make sure you discuss any questions you have with your health care   provider. °  °Document Released: 11/17/2005 Document Revised: 11/22/2013 Document Reviewed: 08/29/2013 °Elsevier Interactive Patient Education ©2016 Elsevier Inc. ° °

## 2016-07-30 ENCOUNTER — Ambulatory Visit (INDEPENDENT_AMBULATORY_CARE_PROVIDER_SITE_OTHER): Payer: Medicaid Other | Admitting: Family

## 2016-07-30 VITALS — BP 129/61 | HR 102 | Wt 152.1 lb

## 2016-07-30 DIAGNOSIS — Z3483 Encounter for supervision of other normal pregnancy, third trimester: Secondary | ICD-10-CM

## 2016-07-30 LAB — POCT URINALYSIS DIP (DEVICE)
Bilirubin Urine: NEGATIVE
Glucose, UA: NEGATIVE mg/dL
Hgb urine dipstick: NEGATIVE
KETONES UR: NEGATIVE mg/dL
Leukocytes, UA: NEGATIVE
Nitrite: NEGATIVE
PH: 6.5 (ref 5.0–8.0)
PROTEIN: NEGATIVE mg/dL
Specific Gravity, Urine: 1.01 (ref 1.005–1.030)
Urobilinogen, UA: 0.2 mg/dL (ref 0.0–1.0)

## 2016-07-30 NOTE — Progress Notes (Signed)
Patient would like to be check today  

## 2016-07-30 NOTE — Progress Notes (Signed)
   PRENATAL VISIT NOTE  Subjective:  Dana Cantu is a 30 y.o. G1P0 at 3558w3d being seen today for ongoing prenatal care.  She is currently monitored for the following issues for this low-risk pregnancy and has Supervision of normal pregnancy, antepartum on her problem list.  Patient reports no complaints.  Contractions: Irregular. Vag. Bleeding: None.  Movement: Present. Denies leaking of fluid.   The following portions of the patient's history were reviewed and updated as appropriate: allergies, current medications, past family history, past medical history, past social history, past surgical history and problem list. Problem list updated.  Objective:   Vitals:   07/30/16 0937  BP: 129/61  Pulse: (!) 102  Weight: 152 lb 1.6 oz (69 kg)    Fetal Status: Fetal Heart Rate (bpm): 141 Fundal Height: 40 cm Movement: Present  Presentation: Vertex  General:  Alert, oriented and cooperative. Patient is in no acute distress.  Skin: Skin is warm and dry. No rash noted.   Cardiovascular: Normal heart rate noted  Respiratory: Normal respiratory effort, no problems with respiration noted  Abdomen: Soft, gravid, appropriate for gestational age. Pain/Pressure: Present     Pelvic:  Cervical exam performed Dilation: Fingertip Effacement (%): 50 Station: -2  Extremities: Normal range of motion.  Edema: Trace  Mental Status: Normal mood and affect. Normal behavior. Normal judgment and thought content.   Urinalysis: Urine Protein: Negative Urine Glucose: Negative  Assessment and Plan:  Pregnancy: G1P0 at 8758w3d  1. Supervision of normal pregnancy, antepartum, third trimester - Kick counts - Discussed post-term pregnancy and IOL  Term labor symptoms and general obstetric precautions including but not limited to vaginal bleeding, contractions, leaking of fluid and fetal movement were reviewed in detail with the patient. Please refer to After Visit Summary for other counseling recommendations.    Return in about 1 week (around 08/06/2016) for appt and NST.  Eino FarberWalidah Kennith GainN Karim, CNM

## 2016-08-06 ENCOUNTER — Ambulatory Visit (INDEPENDENT_AMBULATORY_CARE_PROVIDER_SITE_OTHER): Payer: Medicaid Other | Admitting: Student

## 2016-08-06 ENCOUNTER — Encounter: Payer: Self-pay | Admitting: Student

## 2016-08-06 VITALS — BP 114/85 | HR 99 | Wt 154.6 lb

## 2016-08-06 DIAGNOSIS — Z36 Encounter for antenatal screening of mother: Secondary | ICD-10-CM

## 2016-08-06 DIAGNOSIS — O480 Post-term pregnancy: Secondary | ICD-10-CM

## 2016-08-06 DIAGNOSIS — Z3483 Encounter for supervision of other normal pregnancy, third trimester: Secondary | ICD-10-CM | POA: Diagnosis not present

## 2016-08-06 LAB — POCT URINALYSIS DIP (DEVICE)
BILIRUBIN URINE: NEGATIVE
Glucose, UA: NEGATIVE mg/dL
HGB URINE DIPSTICK: NEGATIVE
KETONES UR: NEGATIVE mg/dL
Leukocytes, UA: NEGATIVE
Nitrite: NEGATIVE
PH: 6 (ref 5.0–8.0)
Protein, ur: NEGATIVE mg/dL
Urobilinogen, UA: 0.2 mg/dL (ref 0.0–1.0)

## 2016-08-06 NOTE — Progress Notes (Signed)
Pt reports decreased FM x3-4 weeks.  She desires IOL @ 41 wks - scheduled.

## 2016-08-06 NOTE — Progress Notes (Signed)
   PRENATAL VISIT NOTE  Subjective:  Dana Cantu is a 30 y.o. G1P0 at 7860w3d being seen today for ongoing prenatal care.  She is currently monitored for the following issues for this low-risk pregnancy and has Supervision of normal pregnancy, antepartum on her problem list.  Patient reports occasional contractions.  Contractions: Irregular. Vag. Bleeding: None.  Movement: (!) Decreased. Denies leaking of fluid.   The following portions of the patient's history were reviewed and updated as appropriate: allergies, current medications, past family history, past medical history, past social history, past surgical history and problem list. Problem list updated.  Objective:   Vitals:   08/06/16 1019  BP: 114/85  Pulse: 99  Weight: 154 lb 9.6 oz (70.1 kg)    Fetal Status: Fetal Heart Rate (bpm): NST   Movement: (!) Decreased  Presentation: Vertex  Reactive NST  General:  Alert, oriented and cooperative. Patient is in no acute distress.  Skin: Skin is warm and dry. No rash noted.   Cardiovascular: Normal heart rate noted  Respiratory: Normal respiratory effort, no problems with respiration noted  Abdomen: Soft, gravid, appropriate for gestational age. Pain/Pressure: Present     Pelvic:  Cervical exam performed      1/80/-3  Extremities: Normal range of motion.  Edema: Trace  Mental Status: Normal mood and affect. Normal behavior. Normal judgment and thought content.   Urinalysis:      Assessment and Plan:  Pregnancy: G1P0 at 1860w3d  1. Post term pregnancy, antepartum condition or complication -reactive NST - Amniotic fluid index with NST -IOL scheduled  2. Supervision of normal pregnancy, antepartum, third trimester   Term labor symptoms and general obstetric precautions including but not limited to vaginal bleeding, contractions, leaking of fluid and fetal movement were reviewed in detail with the patient. Please refer to After Visit Summary for other counseling recommendations.    Return in about 6 weeks (around 09/17/2016) for PP visit. Judeth Horn.  Rayshad Riviello, NP

## 2016-08-06 NOTE — Patient Instructions (Signed)
Labor Induction Labor induction is when steps are taken to cause a pregnant woman to begin the labor process. Most women go into labor on their own between 37 weeks and 42 weeks of the pregnancy. When this does not happen or when there is a medical need, methods may be used to induce labor. Labor induction causes a pregnant woman's uterus to contract. It also causes the cervix to soften (ripen), open (dilate), and thin out (efface). Usually, labor is not induced before 39 weeks of the pregnancy unless there is a problem with the baby or mother.  Before inducing labor, your health care provider will consider a number of factors, including the following:  The medical condition of you and the baby.   How many weeks along you are.   The status of the baby's lung maturity.   The condition of the cervix.   The position of the baby.  WHAT ARE THE REASONS FOR LABOR INDUCTION? Labor may be induced for the following reasons:  The health of the baby or mother is at risk.   The pregnancy is overdue by 1 week or more.   The water breaks but labor does not start on its own.   The mother has a health condition or serious illness, such as high blood pressure, infection, placental abruption, or diabetes.  The amniotic fluid amounts are low around the baby.   The baby is distressed.  Convenience or wanting the baby to be born on a certain date is not a reason for inducing labor. WHAT METHODS ARE USED FOR LABOR INDUCTION? Several methods of labor induction may be used, such as:   Prostaglandin medicine. This medicine causes the cervix to dilate and ripen. The medicine will also start contractions. It can be taken by mouth or by inserting a suppository into the vagina.   Inserting a thin tube (catheter) with a balloon on the end into the vagina to dilate the cervix. Once inserted, the balloon is expanded with water, which causes the cervix to open.   Stripping the membranes. Your health  care provider separates amniotic sac tissue from the cervix, causing the cervix to be stretched and causing the release of a hormone called progesterone. This may cause the uterus to contract. It is often done during an office visit. You will be sent home to wait for the contractions to begin. You will then come in for an induction.   Breaking the water. Your health care provider makes a hole in the amniotic sac using a small instrument. Once the amniotic sac breaks, contractions should begin. This may still take hours to see an effect.   Medicine to trigger or strengthen contractions. This medicine is given through an IV access tube inserted into a vein in your arm.  All of the methods of induction, besides stripping the membranes, will be done in the hospital. Induction is done in the hospital so that you and the baby can be carefully monitored.  HOW LONG DOES IT TAKE FOR LABOR TO BE INDUCED? Some inductions can take up to 2-3 days. Depending on the cervix, it usually takes less time. It takes longer when you are induced early in the pregnancy or if this is your first pregnancy. If a mother is still pregnant and the induction has been going on for 2-3 days, either the mother will be sent home or a cesarean delivery will be needed. WHAT ARE THE RISKS ASSOCIATED WITH LABOR INDUCTION? Some of the risks of induction include:     Changes in fetal heart rate, such as too high, too low, or erratic.   Fetal distress.   Chance of infection for the mother and baby.   Increased chance of having a cesarean delivery.   Breaking off (abruption) of the placenta from the uterus (rare).   Uterine rupture (very rare).  When induction is needed for medical reasons, the benefits of induction may outweigh the risks. WHAT ARE SOME REASONS FOR NOT INDUCING LABOR? Labor induction should not be done if:   It is shown that your baby does not tolerate labor.   You have had previous surgeries on your  uterus, such as a myomectomy or the removal of fibroids.   Your placenta lies very low in the uterus and blocks the opening of the cervix (placenta previa).   Your baby is not in a head-down position.   The umbilical cord drops down into the birth canal in front of the baby. This could cut off the baby's blood and oxygen supply.   You have had a previous cesarean delivery.   There are unusual circumstances, such as the baby being extremely premature.    This information is not intended to replace advice given to you by your health care provider. Make sure you discuss any questions you have with your health care provider.   Document Released: 04/08/2007 Document Revised: 12/08/2014 Document Reviewed: 06/16/2013 Elsevier Interactive Patient Education 2016 [employer]  

## 2016-08-08 ENCOUNTER — Encounter (HOSPITAL_COMMUNITY): Payer: Self-pay

## 2016-08-08 ENCOUNTER — Inpatient Hospital Stay (HOSPITAL_COMMUNITY)
Admission: AD | Admit: 2016-08-08 | Discharge: 2016-08-11 | DRG: 775 | Disposition: A | Discharge status: Home / Self Care | Payer: Medicaid Other | Source: Ambulatory Visit | Attending: Cardiology | Admitting: Cardiology

## 2016-08-08 DIAGNOSIS — Z3A40 40 weeks gestation of pregnancy: Secondary | ICD-10-CM

## 2016-08-08 DIAGNOSIS — IMO0001 Reserved for inherently not codable concepts without codable children: Secondary | ICD-10-CM

## 2016-08-08 DIAGNOSIS — Z3483 Encounter for supervision of other normal pregnancy, third trimester: Secondary | ICD-10-CM

## 2016-08-08 DIAGNOSIS — Z833 Family history of diabetes mellitus: Secondary | ICD-10-CM

## 2016-08-08 DIAGNOSIS — Z3403 Encounter for supervision of normal first pregnancy, third trimester: Secondary | ICD-10-CM | POA: Diagnosis present

## 2016-08-08 LAB — CBC
HEMATOCRIT: 28.9 % — AB (ref 36.0–46.0)
HEMOGLOBIN: 8.9 g/dL — AB (ref 12.0–15.0)
MCH: 20.7 pg — ABNORMAL LOW (ref 26.0–34.0)
MCHC: 30.8 g/dL (ref 30.0–36.0)
MCV: 67.2 fL — ABNORMAL LOW (ref 78.0–100.0)
Platelets: 312 10*3/uL (ref 150–400)
RBC: 4.3 MIL/uL (ref 3.87–5.11)
RDW: 19.9 % — ABNORMAL HIGH (ref 11.5–15.5)
WBC: 7.3 10*3/uL (ref 4.0–10.5)

## 2016-08-08 LAB — TYPE AND SCREEN
ABO/RH(D): A POS
ANTIBODY SCREEN: NEGATIVE

## 2016-08-08 MED ORDER — LACTATED RINGERS IV SOLN
INTRAVENOUS | Status: DC
  Administered 2016-08-09: 02:00:00 via INTRAVENOUS

## 2016-08-08 MED ORDER — OXYCODONE-ACETAMINOPHEN 5-325 MG PO TABS: 2.0000 | ORAL_TABLET | ORAL | Status: DC | PRN

## 2016-08-08 MED ORDER — OXYTOCIN BOLUS FROM INFUSION
500.0000 mL | Freq: Once | INTRAVENOUS | Status: AC
  Administered 2016-08-09: 500 mL via INTRAVENOUS

## 2016-08-08 MED ORDER — FLEET ENEMA 7-19 GM/118ML RE ENEM: 1.0000 | ENEMA | RECTAL | Status: DC | PRN

## 2016-08-08 MED ORDER — ONDANSETRON HCL 4 MG/2ML IJ SOLN: 4.0000 mg | Freq: Four times a day (QID) | INTRAMUSCULAR | Status: DC | PRN

## 2016-08-08 MED ORDER — OXYTOCIN 40 UNITS IN LACTATED RINGERS INFUSION - SIMPLE MED
2.5000 [IU]/h | INTRAVENOUS | Status: DC
  Filled 2016-08-08: qty 1000

## 2016-08-08 MED ORDER — LIDOCAINE HCL (PF) 1 % IJ SOLN
30.0000 mL | INTRAMUSCULAR | Status: DC | PRN
  Administered 2016-08-09: 30 mL via SUBCUTANEOUS
  Filled 2016-08-08: qty 30

## 2016-08-08 MED ORDER — SOD CITRATE-CITRIC ACID 500-334 MG/5ML PO SOLN: 30.0000 mL | ORAL | Status: DC | PRN

## 2016-08-08 MED ORDER — ACETAMINOPHEN 325 MG PO TABS: 650.0000 mg | ORAL_TABLET | ORAL | Status: DC | PRN

## 2016-08-08 MED ORDER — LACTATED RINGERS IV SOLN: 500.0000 mL | INTRAVENOUS | Status: DC | PRN

## 2016-08-08 MED ORDER — OXYCODONE-ACETAMINOPHEN 5-325 MG PO TABS: 1.0000 | ORAL_TABLET | ORAL | Status: DC | PRN

## 2016-08-08 NOTE — MAU Note (Signed)
Patient presents with abdominal pain and pressure

## 2016-08-08 NOTE — MAU Note (Signed)
IV attempted x 1 to left wrist by Laurell Josephsheryl Price Lachapelle RN unsuccessful, 1 attempt by Isabel CapriceKathy Wilson RN unsuccessful, notified Belenda CruiseHeather Mitchell RN BS

## 2016-08-08 NOTE — H&P (Signed)
LABOR AND DELIVERY ADMISSION HISTORY AND PHYSICAL NOTE  Dana Cantu is a 30 y.o. female G1P0 with IUP at [redacted]w[redacted]d by LMP presenting for SOL.   She reports positive fetal movement. She denies leakage of fluid but endorses light vaginal bleeding. She has had contractions since 12:00 today at 13 minute intervals. She denies N/V, RUQ pain.  Prenatal History/Complications: Patient reports no complications of pregnancy  Past Medical History: Past Medical History:  Diagnosis Date  . Medical history non-contributory     Past Surgical History: Past Surgical History:  Procedure Laterality Date  . TONSILLECTOMY      Obstetrical History: OB History    Gravida Para Term Preterm AB Living   1             SAB TAB Ectopic Multiple Live Births                  Social History: patient reports good social support at home, no smokers, no pets. Social History   Social History  . Marital status: Married    Spouse name: N/A  . Number of children: N/A  . Years of education: N/A   Social History Main Topics  . Smoking status: Never Smoker  . Smokeless tobacco: Never Used  . Alcohol use No  . Drug use: No  . Sexual activity: Yes   Other Topics Concern  . None   Social History Narrative  . None    Family History: Family History  Problem Relation Age of Onset  . Diabetes Father     Allergies: No Known Allergies  Prescriptions Prior to Admission  Medication Sig Dispense Refill Last Dose  . loratadine (CLARITIN) 10 MG tablet Take 10 mg by mouth daily as needed for allergies.   Not Taking  . ranitidine (ZANTAC) 150 MG tablet Take 150 mg by mouth 2 (two) times daily.   Not Taking  . terconazole (TERAZOL 7) 0.4 % vaginal cream Place 1 applicator vaginally at bedtime. Use for seven days (Patient not taking: Reported on 08/06/2016) 45 g 0 Not Taking     Review of Systems   All systems reviewed and negative except as stated in HPI  Blood pressure 139/89, pulse 87, temperature 97.7  F (36.5 C), temperature source Oral, resp. rate 18, last menstrual period 10/27/2015. General appearance: alert, cooperative and no distress Lungs: no respiratory distress Heart: regular rate Abdomen: soft, non-tender Extremities: No calf swelling or tenderness Presentation: cephalic by cervical exam Fetal monitoring: baseline 130, moderate variability, + accels Uterine activity: contractions every 2-3 minutes. Dilation: 3.5 Effacement (%): 100 Station: -2 Exam by:: Freddy Finner RN   Prenatal labs: ABO, Rh: A/POS/-- (02/08 1109) Antibody: NEG (02/08 1109) Rubella: Immune RPR: NON REAC (06/14 1107)  HBsAg: NEGATIVE (02/08 1109)  HIV: NONREACTIVE (06/14 1107)  GBS:   negative Genetic screening:  declined Anatomy US: wnl  Prenatal Transfer Tool  Maternal Diabetes: No Genetic Screening: Declined Maternal Ultrasounds/Referrals: Normal Fetal Ultrasounds or other Referrals:  None Maternal Substance Abuse:  No Significant Maternal Medications:  None Significant Maternal Lab Results: Lab values include: Group B Strep negative  No results found for this or any previous visit (from the past 24 hour(s)).  Patient Active Problem List   Diagnosis Date Noted  . Active labor at term 08/08/2016  . Supervision of normal pregnancy, antepartum 01/09/2016    Assessment: Dana Cantu is a 30 y.o. G1P0 at [redacted]w[redacted]d here for SOL  #labor: expectant management #Pain: IV pain medications,  NO #FWB: Category 1 #ID:  GBS negative #MOF: breast #MOC: depo #Circ:  N/A girl  Ernestina PennaNicholas Terez Freimark MD OB Fellow

## 2016-08-08 NOTE — Anesthesia Pain Management Evaluation Note (Signed)
  CRNA Pain Management Visit Note  Patient: Dana Cantu, 30 y.o., female  "Hello I am a [facility] of the anesthesia [facility] at [facility]. We have an anesthesia team available at all times to provide care throughout the hospital, including epidural management and anesthesia for C-section. I don't know your plan for the delivery whether it a natural birth, water birth, IV sedation, nitrous supplementation, doula or epidural, but we want to meet your pain goals."   1.Was your pain managed to your expectations on prior hospitalizations?     2.What is your expectation for pain management during this hospitalization?      3.How can we help you reach that goal?   Record the patient's initial score and the patient's pain goal.   Pain: 8  Pain Goal: 4 [facility] wants you to be able to say your pain was always managed very well.  Dana Cantu,Dana Cantu 08/08/2016

## 2016-08-08 NOTE — MAU Note (Signed)
Notified Dr. Genevie AnnSchenk patient G1 7170w5d contractions every 2 to 3 minutes, cervix 3.5/100/-2 BBOW uncomfortable, received admit orders.

## 2016-08-08 NOTE — MAU Note (Signed)
Contractions since 0100 some vaginal bleeding that just started, positive FM, no LOF, denies problems during pregnancy, [facility] to [facility] was checked ft.

## 2016-08-09 ENCOUNTER — Encounter (HOSPITAL_COMMUNITY): Payer: Self-pay | Admitting: *Deleted

## 2016-08-09 DIAGNOSIS — Z3A40 40 weeks gestation of pregnancy: Secondary | ICD-10-CM

## 2016-08-09 LAB — RPR: RPR Ser Ql: NONREACTIVE

## 2016-08-09 LAB — ABO/RH: ABO/RH(D): A POS

## 2016-08-09 MED ORDER — SODIUM CHLORIDE 0.9 % IV SOLN: 250.0000 mL | INTRAVENOUS | Status: DC | PRN

## 2016-08-09 MED ORDER — OXYTOCIN 40 UNITS IN LACTATED RINGERS INFUSION - SIMPLE MED
1.0000 m[IU]/min | INTRAVENOUS | Status: DC
  Administered 2016-08-09: 2 m[IU]/min via INTRAVENOUS

## 2016-08-09 MED ORDER — SODIUM CHLORIDE 0.9% FLUSH: 3.0000 mL | Freq: Two times a day (BID) | INTRAVENOUS | Status: DC

## 2016-08-09 MED ORDER — BENZOCAINE-MENTHOL 20-0.5 % EX AERO
1.0000 "application " | INHALATION_SPRAY | CUTANEOUS | Status: DC | PRN
  Administered 2016-08-09: 1 via TOPICAL
  Filled 2016-08-09: qty 56

## 2016-08-09 MED ORDER — PRENATAL MULTIVITAMIN CH
1.0000 | ORAL_TABLET | Freq: Every day | ORAL | Status: DC
  Filled 2016-08-09: qty 1

## 2016-08-09 MED ORDER — TERBUTALINE SULFATE 1 MG/ML IJ SOLN
0.2500 mg | Freq: Once | INTRAMUSCULAR | Status: DC | PRN
Start: 2016-08-09 — End: 2016-08-09
  Filled 2016-08-09: qty 1

## 2016-08-09 MED ORDER — ACETAMINOPHEN 325 MG PO TABS: 650.0000 mg | ORAL_TABLET | ORAL | Status: DC | PRN

## 2016-08-09 MED ORDER — ZOLPIDEM TARTRATE 5 MG PO TABS: 5.0000 mg | ORAL_TABLET | Freq: Every evening | ORAL | Status: DC | PRN

## 2016-08-09 MED ORDER — METHYLERGONOVINE MALEATE 0.2 MG/ML IJ SOLN
INTRAMUSCULAR | Status: AC
  Administered 2016-08-09: 0.2 mg via INTRAMUSCULAR
  Filled 2016-08-09: qty 1

## 2016-08-09 MED ORDER — SENNOSIDES-DOCUSATE SODIUM 8.6-50 MG PO TABS
2.0000 | ORAL_TABLET | ORAL | Status: DC
  Administered 2016-08-09 – 2016-08-10 (×2): 2 via ORAL
  Filled 2016-08-09 (×2): qty 2

## 2016-08-09 MED ORDER — ONDANSETRON HCL 4 MG/2ML IJ SOLN: 4.0000 mg | INTRAMUSCULAR | Status: DC | PRN

## 2016-08-09 MED ORDER — WITCH HAZEL-GLYCERIN EX PADS
1.0000 | MEDICATED_PAD | CUTANEOUS | Status: DC | PRN
Start: 2016-08-09 — End: 2016-08-11

## 2016-08-09 MED ORDER — SODIUM CHLORIDE 0.9% FLUSH: 3.0000 mL | INTRAVENOUS | Status: DC | PRN

## 2016-08-09 MED ORDER — ONDANSETRON HCL 4 MG PO TABS: 4.0000 mg | ORAL_TABLET | ORAL | Status: DC | PRN

## 2016-08-09 MED ORDER — IBUPROFEN 100 MG/5ML PO SUSP
600.0000 mg | Freq: Four times a day (QID) | ORAL | Status: DC
  Administered 2016-08-09 – 2016-08-11 (×6): 600 mg via ORAL
  Filled 2016-08-09 (×13): qty 30

## 2016-08-09 MED ORDER — FENTANYL CITRATE (PF) 100 MCG/2ML IJ SOLN
INTRAMUSCULAR | Status: AC
  Filled 2016-08-09: qty 2

## 2016-08-09 MED ORDER — DIBUCAINE 1 % RE OINT: 1.0000 "application " | TOPICAL_OINTMENT | RECTAL | Status: DC | PRN

## 2016-08-09 MED ORDER — TETANUS-DIPHTH-ACELL PERTUSSIS 5-2.5-18.5 LF-MCG/0.5 IM SUSP: 0.5000 mL | Freq: Once | INTRAMUSCULAR | Status: DC

## 2016-08-09 MED ORDER — COCONUT OIL OIL: 1.0000 "application " | TOPICAL_OIL | Status: DC | PRN

## 2016-08-09 MED ORDER — FENTANYL CITRATE (PF) 100 MCG/2ML IJ SOLN
100.0000 ug | INTRAMUSCULAR | Status: DC | PRN
  Administered 2016-08-09 (×4): 100 ug via INTRAVENOUS
  Filled 2016-08-09 (×3): qty 2

## 2016-08-09 MED ORDER — OXYTOCIN 40 UNITS IN LACTATED RINGERS INFUSION - SIMPLE MED: 2.5000 [IU]/h | INTRAVENOUS | Status: DC | PRN

## 2016-08-09 MED ORDER — DIPHENHYDRAMINE HCL 25 MG PO CAPS: 25.0000 mg | ORAL_CAPSULE | Freq: Four times a day (QID) | ORAL | Status: DC | PRN

## 2016-08-09 MED ORDER — IBUPROFEN 600 MG PO TABS
600.0000 mg | ORAL_TABLET | Freq: Four times a day (QID) | ORAL | Status: DC
  Administered 2016-08-09: 600 mg via ORAL
  Filled 2016-08-09: qty 1

## 2016-08-09 MED ORDER — SIMETHICONE 80 MG PO CHEW: 80.0000 mg | CHEWABLE_TABLET | ORAL | Status: DC | PRN

## 2016-08-09 NOTE — Progress Notes (Signed)
Patient ID: Dana Cantu, female   DOB: 07/14/1986, 30 y.o.   MRN: [id number]  S: Patient seen & examined for progress of labor. Patient is breathing through contractions and uncomfortable.   O:  Vitals:   08/09/16 0355 08/09/16 0435 08/09/16 0522 08/09/16 0544  BP: 112/82 (!) 144/89 124/86   Pulse: (!) 138 69 81   Resp: 18 18 18 18   Temp: 98.1 F (36.7 C)   98.4 F (36.9 C)  TempSrc: Oral   Oral  Weight:      Height:       Bulging bag present, unable to feel cervix. AROM performed with large amounts of clear fluid return.   Dilation: Lip/rim Effacement (%): 90 Cervical Position: Middle Station: 0, +1 Presentation: Vertex Exam by:: Deliana Avalos   FHT: 120, mod var, +accels, no decels TOCO: q1-2 min  A/P: 30 y.o. G1P0 6032w6d here for SOL.  AROM- clear Expectant management, anticipate SVD

## 2016-08-09 NOTE — Progress Notes (Signed)
OB Note  CTSP re: concern for partial 3rd degree after SVD.  Rectal exam and perineum exam done and apex approx 5cm from introitus and capsule and anal spinc. intact and tear is 2nd degree.  Repair to be done by Dr. Darci CurrentMumaw  Valerya Maxton, Jr MD Attending Center [facility](Faculty Practice)

## 2016-08-10 ENCOUNTER — Inpatient Hospital Stay (HOSPITAL_COMMUNITY): Admission: RE | Admit: 2016-08-10 | Payer: Medicaid Other | Source: Ambulatory Visit

## 2016-08-10 LAB — CBC
HCT: 23.8 % — ABNORMAL LOW (ref 36.0–46.0)
Hemoglobin: 7.4 g/dL — ABNORMAL LOW (ref 12.0–15.0)
MCH: 20.7 pg — ABNORMAL LOW (ref 26.0–34.0)
MCHC: 31.1 g/dL (ref 30.0–36.0)
MCV: 66.7 fL — ABNORMAL LOW (ref 78.0–100.0)
PLATELETS: 256 10*3/uL (ref 150–400)
RBC: 3.57 MIL/uL — AB (ref 3.87–5.11)
RDW: 20 % — AB (ref 11.5–15.5)
WBC: 13.5 10*3/uL — AB (ref 4.0–10.5)

## 2016-08-10 MED ORDER — COMPLETENATE 29-1 MG PO CHEW
1.0000 | CHEWABLE_TABLET | Freq: Every day | ORAL | Status: DC
  Administered 2016-08-10 – 2016-08-11 (×2): 1 via ORAL
  Filled 2016-08-10 (×3): qty 1

## 2016-08-10 NOTE — Lactation Note (Signed)
This note was copied from a baby's chart. Lactation Consultation Note  Patient Name: Dana Cantu YNWGN'FToday's Date: 08/10/2016 Reason for consult: Initial assessment  Baby 9038 hours old. Baby sleeping in crib and parents resting. Mom reports that baby is latching and nursing well. Mom states that she is nursing the baby when she cues, and is seeing colostrum and baby seems satisfied after nursing. [facility], aware of OP/BFSG and LC phone line assistance after D/C.  Maternal Data Has patient been taught Hand Expression?: Yes Does the patient have breastfeeding experience prior to this delivery?: No  Feeding    LATCH Score/Interventions                      Lactation Tools Discussed/Used     Consult Status Consult Status: Follow-up Date: 08/11/16 Follow-up type: In-patient    Sherlyn HayJennifer D Emila Steinhauser 08/10/2016, 11:11 PM

## 2016-08-10 NOTE — Progress Notes (Signed)
Patient ID: Dana Cantu, female   DOB: 10/08/1986, 30 y.o.   MRN: [id number]  POSTPARTUM PROGRESS NOTE  Post Partum Day #1 Subjective:  Dana Cantu is a 30 y.o. G1P1001 7156w6d s/p SVD.  No acute events overnight.  Pt denies problems with ambulating, voiding or po intake.  She denies nausea or vomiting.  Pain is well controlled.  She has had flatus. She has had bowel movement.  Lochia Small.   Objective: Blood pressure 116/66, pulse 97, temperature 98.5 F (36.9 C), temperature source Oral, resp. rate 16, height 5\' 2"  (1.575 m), weight 154 lb (69.9 kg), last menstrual period 10/27/2015, SpO2 99 %, unknown if currently breastfeeding.  Physical Exam:  General: alert, cooperative and no distress Lochia:normal flow Chest: CTAB Heart: RRR no m/r/g Abdomen: +BS, soft, nontender,  Uterine Fundus: firm, below umbilicus DVT Evaluation: No calf swelling or tenderness Extremities: Trace edema   Recent Labs  08/08/16 1910 08/10/16 0556  HGB 8.9* 7.4*  HCT 28.9* 23.8*    Assessment/Plan:  ASSESSMENT: Dana Cantu is a 30 y.o. G1P1001 6656w6d s/p NSVD.  Plan for discharge tomorrow, Breastfeeding and Contraception Depo   LOS: 2 days   Jen MowElizabeth Nikya Busler, DO 08/10/2016, 8:55 AM

## 2016-08-11 DIAGNOSIS — IMO0001 Reserved for inherently not codable concepts without codable children: Secondary | ICD-10-CM

## 2016-08-11 MED ORDER — IBUPROFEN 100 MG/5ML PO SUSP: 600.0000 mg | Freq: Four times a day (QID) | ORAL | 0 refills | Status: DC

## 2016-08-11 NOTE — Discharge Instructions (Signed)
Vaginal Delivery, Care After °Refer to this sheet in the next few weeks. These discharge instructions provide you with information on caring for yourself after delivery. Your health care provider may also give you specific instructions. Your treatment has been planned according to the most current medical practices available, but problems sometimes occur. Call your health care provider if you have any problems or questions after you go home. °HOME CARE INSTRUCTIONS °· Take over-the-counter or prescription medicines only as directed by your health care provider or pharmacist. °· Do not drink alcohol, especially if you are breastfeeding or taking medicine to relieve pain. °· Do not chew or smoke tobacco. °· Do not use illegal drugs. °· Continue to use good perineal care. Good perineal care includes: °· Wiping your perineum from front to back. °· Keeping your perineum clean. °· Do not use tampons or douche until your health care provider says it is okay. °· Shower, wash your hair, and take tub baths as directed by your health care provider. °· Wear a well-fitting bra that provides breast support. °· Eat healthy foods. °· Drink enough fluids to keep your urine clear or pale yellow. °· Eat high-fiber foods such as whole grain cereals and breads, brown rice, beans, and fresh fruits and vegetables every day. These foods may help prevent or relieve constipation. °· Follow your health care provider's recommendations regarding resumption of activities such as climbing stairs, driving, lifting, exercising, or traveling. °· Talk to your health care provider about resuming sexual activities. Resumption of sexual activities is dependent upon your risk of infection, your rate of healing, and your comfort and desire to resume sexual activity. °· Try to have someone help you with your household activities and your newborn for at least a few days after you leave the hospital. °· Rest as much as possible. Try to rest or take a nap  when your newborn is sleeping. °· Increase your activities gradually. °· Keep all of your scheduled postpartum appointments. It is very important to keep your scheduled follow-up appointments. At these appointments, your health care provider will be checking to make sure that you are healing physically and emotionally. °SEEK MEDICAL CARE IF:  °· You are passing large clots from your vagina. Save any clots to show your health care provider. °· You have a foul smelling discharge from your vagina. °· You have trouble urinating. °· You are urinating frequently. °· You have pain when you urinate. °· You have a change in your bowel movements. °· You have increasing redness, pain, or swelling near your vaginal incision (episiotomy) or vaginal tear. °· You have pus draining from your episiotomy or vaginal tear. °· Your episiotomy or vaginal tear is separating. °· You have painful, hard, or reddened breasts. °· You have a severe headache. °· You have blurred vision or see spots. °· You feel sad or depressed. °· You have thoughts of hurting yourself or your newborn. °· You have questions about your care, the care of your newborn, or medicines. °· You are dizzy or light-headed. °· You have a rash. °· You have nausea or vomiting. °· You were breastfeeding and have not had a menstrual period within 12 weeks after you stopped breastfeeding. °· You are not breastfeeding and have not had a menstrual period by the 12th week after delivery. °· You have a fever. °SEEK IMMEDIATE MEDICAL CARE IF:  °· You have persistent pain. °· You have chest pain. °· You have shortness of breath. °· You faint. °· You   have leg pain. °· You have stomach pain. °· Your vaginal bleeding saturates two or more sanitary pads in 1 hour. °  °This information is not intended to replace advice given to you by your health care provider. Make sure you discuss any questions you have with your health care provider. °  °Document Released: 11/14/2000 Document Revised:  08/08/2015 Document Reviewed: 07/14/2012 °Elsevier Interactive Patient Education ©2016 [employer] ° °Breastfeeding °Deciding to breastfeed is one of the best choices you can make for you and your baby. A change in hormones during pregnancy causes your breast tissue to grow and increases the number and size of your milk ducts. These hormones also allow proteins, sugars, and fats from your blood supply to make breast milk in your milk-producing glands. Hormones prevent breast milk from being released before your baby is born as well as prompt milk flow after birth. Once breastfeeding has begun, thoughts of your baby, as well as his or her sucking or crying, can stimulate the release of milk from your milk-producing glands.  °BENEFITS OF BREASTFEEDING °For Your Baby °· Your first milk (colostrum) helps your baby's digestive system function better. °· There are antibodies in your milk that help your baby fight off infections. °· Your baby has a lower incidence of asthma, allergies, and sudden infant death syndrome. °· The nutrients in breast milk are better for your baby than infant formulas and are designed uniquely for your baby's needs. °· Breast milk improves your baby's brain development. °· Your baby is less likely to develop other conditions, such as childhood obesity, asthma, or type 2 diabetes mellitus. °For You °· Breastfeeding helps to create a very special bond between you and your baby. °· Breastfeeding is convenient. Breast milk is always available at the correct temperature and costs nothing. °· Breastfeeding helps to burn calories and helps you lose the weight gained during pregnancy. °· Breastfeeding makes your uterus contract to its prepregnancy size faster and slows bleeding (lochia) after you give birth.   °· Breastfeeding helps to lower your risk of developing type 2 diabetes mellitus, osteoporosis, and breast or ovarian cancer later in life. °SIGNS THAT YOUR BABY IS HUNGRY °Early Signs of  Hunger °· Increased alertness or activity. °· Stretching. °· Movement of the head from side to side. °· Movement of the head and opening of the mouth when the corner of the mouth or cheek is stroked (rooting). °· Increased sucking sounds, smacking lips, cooing, sighing, or squeaking. °· Hand-to-mouth movements. °· Increased sucking of fingers or hands. °Late Signs of Hunger °· Fussing. °· Intermittent crying. °Extreme Signs of Hunger °Signs of extreme hunger will require calming and consoling before your baby will be able to breastfeed successfully. Do not wait for the following signs of extreme hunger to occur before you initiate breastfeeding: °· Restlessness. °· A loud, strong cry. °· Screaming. °BREASTFEEDING BASICS °Breastfeeding Initiation °· Find a comfortable place to sit or lie down, with your neck and back well supported. °· Place a pillow or rolled up blanket under your baby to bring him or her to the level of your breast (if you are seated). Nursing pillows are specially designed to help support your arms and your baby while you breastfeed. °· Make sure that your baby's abdomen is facing your abdomen. °· Gently massage your breast. With your fingertips, massage from your chest wall toward your nipple in a circular motion. This encourages milk flow. You may need to continue this action during the feeding if your   milk flows slowly. °· Support your breast with 4 fingers underneath and your thumb above your nipple. Make sure your fingers are well away from your nipple and your baby's mouth. °· Stroke your baby's lips gently with your finger or nipple. °· When your baby's mouth is open wide enough, quickly bring your baby to your breast, placing your entire nipple and as much of the colored area around your nipple (areola) as possible into your baby's mouth. °¨ More areola should be visible above your baby's upper lip than below the lower lip. °¨ Your baby's tongue should be between his or her lower gum and  your breast. °· Ensure that your baby's mouth is correctly positioned around your nipple (latched). Your baby's lips should create a seal on your breast and be turned out (everted). °· It is common for your baby to suck about 2-3 minutes in order to start the flow of breast milk. °Latching °Teaching your baby how to latch on to your breast properly is very important. An improper latch can cause nipple pain and decreased milk supply for you and poor weight gain in your baby. Also, if your baby is not latched onto your nipple properly, he or she may swallow some air during feeding. This can make your baby fussy. Burping your baby when you switch breasts during the feeding can help to get rid of the air. However, teaching your baby to latch on properly is still the best way to prevent fussiness from swallowing air while breastfeeding. °Signs that your baby has successfully latched on to your nipple: °· Silent tugging or silent sucking, without causing you pain. °· Swallowing heard between every 3-4 sucks. °· Muscle movement above and in front of his or her ears while sucking. °Signs that your baby has not successfully latched on to nipple: °· Sucking sounds or smacking sounds from your baby while breastfeeding. °· Nipple pain. °If you think your baby has not latched on correctly, slip your finger into the corner of your baby's mouth to break the suction and place it between your baby's gums. Attempt breastfeeding initiation again. °Signs of Successful Breastfeeding °Signs from your baby: °· A gradual decrease in the number of sucks or complete cessation of sucking. °· Falling asleep. °· Relaxation of his or her body. °· Retention of a small amount of milk in his or her mouth. °· Letting go of your breast by himself or herself. °Signs from you: °· Breasts that have increased in firmness, weight, and size 1-3 hours after feeding. °· Breasts that are softer immediately after breastfeeding. °· Increased milk volume, as  well as a change in milk consistency and color by the fifth day of breastfeeding. °· Nipples that are not sore, cracked, or bleeding. °Signs That Your Baby is Getting Enough Milk °· Wetting at least 3 diapers in a 24-hour period. The urine should be clear and pale yellow by age 5 days. °· At least 3 stools in a 24-hour period by age 5 days. The stool should be soft and yellow. °· At least 3 stools in a 24-hour period by age 7 days. The stool should be seedy and yellow. °· No loss of weight greater than 10% of birth weight during the first 3 days of age. °· Average weight gain of 4-7 ounces (113-198 g) per week after age 4 days. °· Consistent daily weight gain by age 5 days, without weight loss after the age of 2 weeks. °After a feeding, your baby may spit   up a small amount. This is common. °BREASTFEEDING FREQUENCY AND DURATION °Frequent feeding will help you make more milk and can prevent sore nipples and breast engorgement. Breastfeed when you feel the need to reduce the fullness of your breasts or when your baby shows signs of hunger. This is called "breastfeeding on demand." Avoid introducing a pacifier to your baby while you are working to establish breastfeeding (the first 4-6 weeks after your baby is born). After this time you may choose to use a pacifier. Research has shown that pacifier use during the first year of a baby's life decreases the risk of sudden infant death syndrome (SIDS). °Allow your baby to feed on each breast as long as he or she wants. Breastfeed until your baby is finished feeding. When your baby unlatches or falls asleep while feeding from the first breast, offer the second breast. Because newborns are often sleepy in the first few weeks of life, you may need to awaken your baby to get him or her to feed. °Breastfeeding times will vary from baby to baby. However, the following rules can serve as a guide to help you ensure that your baby is properly fed: °· Newborns (babies 4 weeks of age  or younger) may breastfeed every 1-3 hours. °· Newborns should not go longer than 3 hours during the day or 5 hours during the night without breastfeeding. °· You should breastfeed your baby a minimum of 8 times in a 24-hour period until you begin to introduce solid foods to your baby at around 6 months of age. °BREAST MILK PUMPING °Pumping and storing breast milk allows you to ensure that your baby is exclusively fed your breast milk, even at times when you are unable to breastfeed. This is especially important if you are going back to work while you are still breastfeeding or when you are not able to be present during feedings. Your lactation consultant can give you guidelines on how long it is safe to store breast milk. °A breast pump is a machine that allows you to pump milk from your breast into a sterile bottle. The pumped breast milk can then be stored in a refrigerator or freezer. Some breast pumps are operated by hand, while others use electricity. Ask your lactation consultant which type will work best for you. Breast pumps can be purchased, but some hospitals and breastfeeding support groups lease breast pumps on a monthly basis. A lactation consultant can teach you how to hand express breast milk, if you prefer not to use a pump. °CARING FOR YOUR BREASTS WHILE YOU BREASTFEED °Nipples can become dry, cracked, and sore while breastfeeding. The following recommendations can help keep your breasts moisturized and healthy: °· Avoid using soap on your nipples. °· Wear a supportive bra. Although not required, special nursing bras and tank tops are designed to allow access to your breasts for breastfeeding without taking off your entire bra or top. Avoid wearing underwire-style bras or extremely tight bras. °· Air dry your nipples for 3-4 minutes after each feeding. °· Use only cotton bra pads to absorb leaked breast milk. Leaking of breast milk between feedings is normal. °· Use lanolin on your nipples after  breastfeeding. Lanolin helps to maintain your skin's normal moisture barrier. If you use pure lanolin, you do not need to wash it off before feeding your baby again. Pure lanolin is not toxic to your baby. You may also hand express a few drops of breast milk and gently massage that milk into your   nipples and allow the milk to air dry. °In the first few weeks after giving birth, some women experience extremely full breasts (engorgement). Engorgement can make your breasts feel heavy, warm, and tender to the touch. Engorgement peaks within 3-5 days after you give birth. The following recommendations can help ease engorgement: °· Completely empty your breasts while breastfeeding or pumping. You may want to start by applying warm, moist heat (in the shower or with warm water-soaked hand towels) just before feeding or pumping. This increases circulation and helps the milk flow. If your baby does not completely empty your breasts while breastfeeding, pump any extra milk after he or she is finished. °· Wear a snug bra (nursing or regular) or tank top for 1-2 days to signal your body to slightly decrease milk production. °· Apply ice packs to your breasts, unless this is too uncomfortable for you. °· Make sure that your baby is latched on and positioned properly while breastfeeding. °If engorgement persists after 48 hours of following these recommendations, contact your health care provider or a lactation consultant. °OVERALL HEALTH CARE RECOMMENDATIONS WHILE BREASTFEEDING °· Eat healthy foods. Alternate between meals and snacks, eating 3 of each per day. Because what you eat affects your breast milk, some of the foods may make your baby more irritable than usual. Avoid eating these foods if you are sure that they are negatively affecting your baby. °· Drink milk, fruit juice, and water to satisfy your thirst (about 10 glasses a day). °· Rest often, relax, and continue to take your prenatal vitamins to prevent fatigue,  stress, and anemia. °· Continue breast self-awareness checks. °· Avoid chewing and smoking tobacco. Chemicals from cigarettes that pass into breast milk and exposure to secondhand smoke may harm your baby. °· Avoid alcohol and drug use, including marijuana. °Some medicines that may be harmful to your baby can pass through breast milk. It is important to ask your health care provider before taking any medicine, including all over-the-counter and prescription medicine as well as vitamin and herbal supplements. °It is possible to become pregnant while breastfeeding. If birth control is desired, ask your health care provider about options that will be safe for your baby. °SEEK MEDICAL CARE IF: °· You feel like you want to stop breastfeeding or have become frustrated with breastfeeding. °· You have painful breasts or nipples. °· Your nipples are cracked or bleeding. °· Your breasts are red, tender, or warm. °· You have a swollen area on either breast. °· You have a fever or chills. °· You have nausea or vomiting. °· You have drainage other than breast milk from your nipples. °· Your breasts do not become full before feedings by the fifth day after you give birth. °· You feel sad and depressed. °· Your baby is too sleepy to eat well. °· Your baby is having trouble sleeping.   °· Your baby is wetting less than 3 diapers in a 24-hour period. °· Your baby has less than 3 stools in a 24-hour period. °· Your baby's skin or the white part of his or her eyes becomes yellow.   °· Your baby is not gaining weight by 5 days of age. °SEEK IMMEDIATE MEDICAL CARE IF: °· Your baby is overly tired (lethargic) and does not want to wake up and feed. °· Your baby develops an unexplained fever. °  °This information is not intended to replace advice given to you by your health care provider. Make sure you discuss any questions you have with your   health care provider. °  °Document Released: 11/17/2005 Document Revised: 08/08/2015 Document  Reviewed: 05/11/2013 °Elsevier Interactive Patient Education ©2016 [employer] ° ° ° °

## 2016-08-11 NOTE — Lactation Note (Signed)
This note was copied from a baby's chart. Lactation Consultation Note  Mother reports that BF is going well. Minimal assist with positioning. Mother instructed on output for DOL. Understanding verablized. Hand expression reviewed. Aware of support groups and outpatient services.  Patient Name: Dana Cantu ZOXWR'UToday's Date: 08/11/2016 Reason for consult: Follow-up assessment   Maternal Data Has patient been taught Hand Expression?: Yes  Feeding Feeding Type: Breast Fed  LATCH Score/Interventions Latch: Grasps breast easily, tongue down, lips flanged, rhythmical sucking. Intervention(s): Adjust position  Audible Swallowing: A few with stimulation (many with stimulation) Intervention(s): Hand expression  Type of Nipple: Everted at rest and after stimulation Intervention(s): No intervention needed  Comfort (Breast/Nipple): Soft / non-tender     Hold (Positioning): Assistance needed to correctly position infant at breast and maintain latch.  LATCH Score: 8  Lactation Tools Discussed/Used     Consult Status Consult Status: Complete    Soyla DryerJoseph, Lillee Mooneyhan 08/11/2016, 11:49 AM

## 2016-08-11 NOTE — Discharge Summary (Signed)
OB Discharge Summary     Patient Name: Dana Cantu DOB: 06/18/1986 MRN: [id number]  Date of admission: 08/08/2016 Delivering MD: Michaele OfferMUMAW, ELIZABETH WOODLAND   Date of discharge: 08/11/2016  Admitting diagnosis: 40.6w constant ctx, mucus plug out, pressure, back pain Intrauterine pregnancy: 2346w6d     Secondary diagnosis:  Active Problems:   Active labor at term   Status post vaginal delivery  Additional problems: none     Discharge diagnosis: Term Pregnancy Delivered                                                                                                Post partum procedures:none  Augmentation: AROM and Pitocin  Complications: None  Hospital course:  Onset of Labor With Vaginal Delivery     30 y.o. yo G1P1001 at 846w6d was admitted in Latent Labor on 08/08/2016. Patient had an uncomplicated labor course as follows:  Membrane Rupture Time/Date: 5:39 AM ,08/09/2016   Intrapartum Procedures: Episiotomy: None [1]                                         Lacerations:  2nd degree [3]  Patient had a delivery of a Viable infant. 08/09/2016  Information for the patient's newborn:  Othella Boyerhmed, Girl Sumi [[id number]][[id number]]  Delivery Method: Vaginal, Spontaneous Delivery (Filed from Delivery Summary)    Pateint had an uncomplicated postpartum course.  She is ambulating, tolerating a regular diet, passing flatus, and urinating well. Patient is discharged home in stable condition on 08/11/16.    Physical exam Vitals:   08/09/16 2226 08/10/16 0622 08/10/16 1845 08/11/16 0646  BP: 129/61 116/66 130/82 (!) 149/89  Pulse: 71 97 89 79  Resp: 18 16 16 17   Temp: 98.1 F (36.7 C) 98.5 F (36.9 C) 98.2 F (36.8 C) 98.8 F (37.1 C)  TempSrc: Oral Oral Oral Oral  SpO2: 99%     Weight:      Height:       General: alert, cooperative and no distress Lochia: appropriate Uterine Fundus: firm Incision: N/A DVT Evaluation: No evidence of DVT seen on physical exam. Negative Homan's sign. No cords or  calf tenderness. Labs: Lab Results  Component Value Date   WBC 13.5 (H) 08/10/2016   HGB 7.4 (L) 08/10/2016   HCT 23.8 (L) 08/10/2016   MCV 66.7 (L) 08/10/2016   PLT 256 08/10/2016   CMP Latest Ref Rng & Units 09/08/2011  Glucose 70 - 99 mg/dL 782(N116(H)  BUN 6 - 23 mg/dL 5(L)  Creatinine 5.620.50 - 1.10 mg/dL 1.30(Q0.47(L)  Sodium 657135 - 846145 mEq/L 138  Potassium 3.5 - 5.1 mEq/L 4.1  Chloride 96 - 112 mEq/L 103  CO2 19 - 32 mEq/L 25  Calcium 8.4 - 10.5 mg/dL 9.6    Discharge instruction: per After Visit Summary and "Baby and Me Booklet".  After visit meds:    Medication List    TAKE these medications   ibuprofen 100 MG/5ML suspension Commonly known as:  ADVIL,MOTRIN Take 30 mLs (  600 mg total) by mouth every 6 (six) hours.   loratadine 10 MG tablet Commonly known as:  CLARITIN Take 10 mg by mouth daily as needed for allergies.   ranitidine 150 MG tablet Commonly known as:  ZANTAC Take 150 mg by mouth 2 (two) times daily as needed for heartburn.   terconazole 0.4 % vaginal cream Commonly known as:  TERAZOL 7 Place 1 applicator vaginally at bedtime. Use for seven days       Diet: routine diet  Activity: Advance as tolerated. Pelvic rest for 6 weeks.   Outpatient follow up:6 weeks Follow up Appt: Future Appointments Date Time Provider Department Center  09/10/2016 2:00 PM Aviva Signs, CNM [facility]   Follow up Visit:No Follow-up on file.  Postpartum contraception: Depo Provera  Newborn Data: Live born female  Birth Weight: 7 lb 11.5 oz (3500 g) APGAR: 6, 8  Baby Feeding: Breast Disposition:home with mother  08/11/2016 Frederik Pear, MD

## 2016-08-11 NOTE — Progress Notes (Signed)
UR chart review completed.  

## 2016-08-23 NOTE — Discharge Summary (Signed)
OB Discharge Summary                           Patient Name: Dana JesterLuul F Alicea DOB: 07/21/1986 MRN: [id number]  Date of admission: 08/08/2016 Delivering MD: Michaele OfferMUMAW, ELIZABETH WOODLAND   Date of discharge: 08/11/2016  Admitting diagnosis: 40.6w constant ctx, mucus plug out, pressure, back pain Intrauterine pregnancy: 7765w6d     Secondary diagnosis:  Active Problems:   Active labor at term   Status post vaginal delivery  Additional problems: none                                      Discharge diagnosis: Term Pregnancy Delivered                                                                                                Post partum procedures:none  Augmentation: AROM and Pitocin  Complications: None  Hospital course:  Onset of Labor With Vaginal Delivery     30 y.o. yo G1P1001 at 965w6d was admitted in Latent Labor on 08/08/2016. Patient had an uncomplicated labor course as follows:  Membrane Rupture Time/Date: 5:39 AM ,08/09/2016   Intrapartum Procedures: Episiotomy: None [1]                                         Lacerations:  2nd degree [3]  Patient had a delivery of a Viable infant. 08/09/2016  Information for the patient's newborn:  Othella Boyerhmed, Girl Jennavieve [[id number]][[id number]]  Delivery Method: Vaginal, Spontaneous Delivery (Filed from Delivery Summary)    Pateint had an uncomplicated postpartum course.  She is ambulating, tolerating a regular diet, passing flatus, and urinating well. Patient is discharged home in stable condition on 08/11/16.          Physical exam Vitals:   08/09/16 2226 08/10/16 0622 08/10/16 1845 08/11/16 0646  BP: 129/61 116/66 130/82 (!) 149/89  Pulse: 71 97 89 79  Resp: 18 16 16 17   Temp: 98.1 F (36.7 C) 98.5 F (36.9 C) 98.2 F (36.8 C) 98.8 F (37.1 C)  TempSrc: Oral Oral Oral Oral  SpO2: 99%     Weight:      Height:       General: alert, cooperative and no distress Lochia: appropriate Uterine Fundus:  firm Incision: N/A DVT Evaluation: No evidence of DVT seen on physical exam. Negative Homan's sign. No cords or calf tenderness. Labs: Recent Labs       Lab Results  Component Value Date   WBC 13.5 (H) 08/10/2016   HGB 7.4 (L) 08/10/2016   HCT 23.8 (L) 08/10/2016   MCV 66.7 (L) 08/10/2016   PLT 256 08/10/2016     CMP Latest Ref Rng & Units 09/08/2011  Glucose 70 - 99 mg/dL 086(V116(H)  BUN 6 - 23 mg/dL 5(L)  Creatinine 7.840.50 - 1.10 mg/dL 6.96(E0.47(L)  Sodium 952135 - 841145 mEq/L 138  Potassium  3.5 - 5.1 mEq/L 4.1  Chloride 96 - 112 mEq/L 103  CO2 19 - 32 mEq/L 25  Calcium 8.4 - 10.5 mg/dL 9.6    Discharge instruction: per After Visit Summary and "Baby and Me Booklet".  After visit meds:    Medication List    TAKE these medications   ibuprofen 100 MG/5ML suspension Commonly known as:  ADVIL,MOTRIN Take 30 mLs (600 mg total) by mouth every 6 (six) hours.  loratadine 10 MG tablet Commonly known as:  CLARITIN Take 10 mg by mouth daily as needed for allergies.  ranitidine 150 MG tablet Commonly known as:  ZANTAC Take 150 mg by mouth 2 (two) times daily as needed for heartburn.  terconazole 0.4 % vaginal cream Commonly known as:  TERAZOL 7 Place 1 applicator vaginally at bedtime. Use for seven days      Diet: routine diet  Activity: Advance as tolerated. Pelvic rest for 6 weeks.   Outpatient follow up:6 weeks Follow up Appt: Future Appointments Date Time Provider Department Center  09/10/2016 2:00 PM Aviva Signs, CNM [facility]   Follow up Visit:No Follow-up on file.  Postpartum contraception: Depo Provera  Newborn Data: Live born female  Birth Weight: 7 lb 11.5 oz (3500 g) APGAR: 6, 8  Baby Feeding: Breast Disposition:home with mother  08/11/2016 Frederik Pear, MD

## 2016-09-10 ENCOUNTER — Ambulatory Visit (INDEPENDENT_AMBULATORY_CARE_PROVIDER_SITE_OTHER): Payer: Medicaid Other | Admitting: Advanced Practice Midwife

## 2016-09-10 ENCOUNTER — Encounter: Payer: Self-pay | Admitting: Advanced Practice Midwife

## 2016-09-10 DIAGNOSIS — Z3040 Encounter for surveillance of contraceptives, unspecified: Secondary | ICD-10-CM | POA: Diagnosis not present

## 2016-09-10 DIAGNOSIS — Z3042 Encounter for surveillance of injectable contraceptive: Secondary | ICD-10-CM | POA: Diagnosis present

## 2016-09-10 DIAGNOSIS — Z3202 Encounter for pregnancy test, result negative: Secondary | ICD-10-CM

## 2016-09-10 LAB — POCT PREGNANCY, URINE: Preg Test, Ur: NEGATIVE

## 2016-09-10 MED ORDER — MEDROXYPROGESTERONE ACETATE 150 MG/ML IM SUSP
150.0000 mg | Freq: Once | INTRAMUSCULAR | Status: AC
  Administered 2016-09-10: 150 mg via INTRAMUSCULAR

## 2016-09-10 NOTE — Patient Instructions (Signed)

## 2016-09-10 NOTE — Progress Notes (Signed)
Subjective:     Jonella Reynaldo MiniumF Littlefield is a 30 y.o. female who presents for a postpartum visit. She is 4 weeks postpartum following a spontaneous vaginal delivery. I have fully reviewed the prenatal and intrapartum course. The delivery was at 8345w5d gestational weeks. Outcome: spontaneous vaginal delivery. Anesthesia: none. Postpartum course has been uneventful. Baby's course has been uneventful. Baby is feeding by breast. Bleeding no bleeding. Bowel function is normal. Bladder function is normal. Patient is not sexually active. Contraception method is Depo-Provera injections. Postpartum depression screening: negative.  The following portions of the patient's history were reviewed and updated as appropriate: allergies, current medications, past family history, past medical history, past social history, past surgical history and problem list.  Review of Systems Pertinent items are noted in HPI.   Objective:    There were no vitals taken for this visit.  General:  alert, cooperative and no distress   Breasts:  inspection negative, no nipple discharge or bleeding, no masses or nodularity palpable  Lungs: clear to auscultation bilaterally  Heart:  regular rate and rhythm, S1, S2 normal, no murmur, click, rub or gallop  Abdomen: soft, non-tender; bowel sounds normal; no masses,  no organomegaly   Vulva:  not evaluated  Vagina: not evaluated  Cervix:  not evaluated  Corpus: not examined  Adnexa:  not evaluated  Rectal Exam: Not performed.        Assessment:     Normal postpartum exam. Pap smear not done at today's visit. Pap normal in Feb 2017  Plan:    1. Contraception: Depo-Provera injections 2. Long discussion of DepoProvera including possible delay in return to fertility for up to a year after cessation, as well as irregular bleeding first 3-6 months and then amenorrhea after injections  Also discussed black box warining about osteoporosis after 2 years of use 3. Follow up in: 1 Year or as needed.

## 2016-09-26 ENCOUNTER — Telehealth: Payer: Self-pay | Admitting: General Practice

## 2016-09-26 IMAGING — US US OB TRANSVAGINAL
1 series · 15 of 28 positions shown · non-contrast
Comparison: 12/03/2015

CLINICAL DATA: Followup viability. LMP 10/27/2015. By LMP patient
is 6 weeks 6 days. EDC by LMP is 08/02/2016.

EXAM:
TRANSVAGINAL OB ULTRASOUND
TECHNIQUE: Transvaginal ultrasound was performed for complete evaluation of the
gestation as well as the maternal uterus, adnexal regions, and
pelvic cul-de-sac.

[Series 1: us ob transvaginal · 15 of 32 slices shown]
[im 1/32]
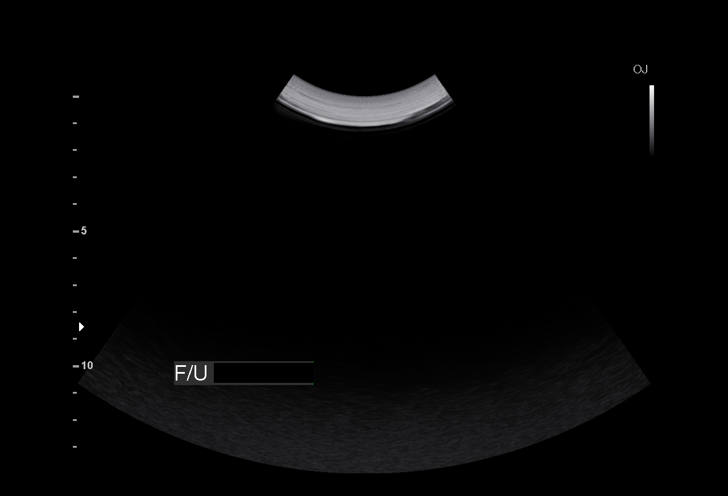
[im 3/32]
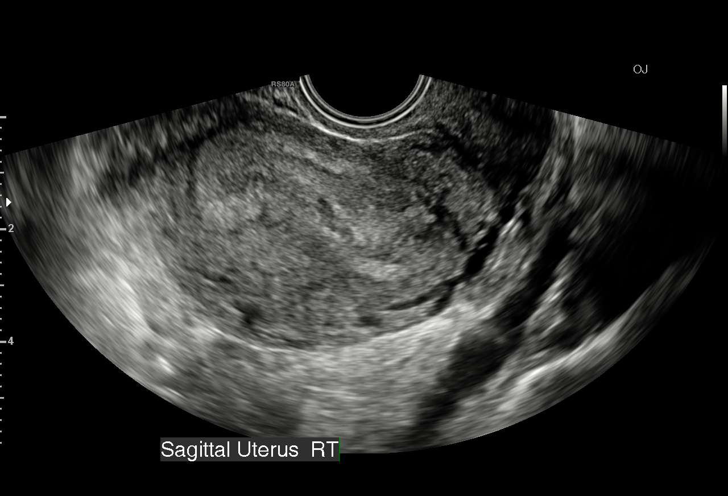
[im 5/32]
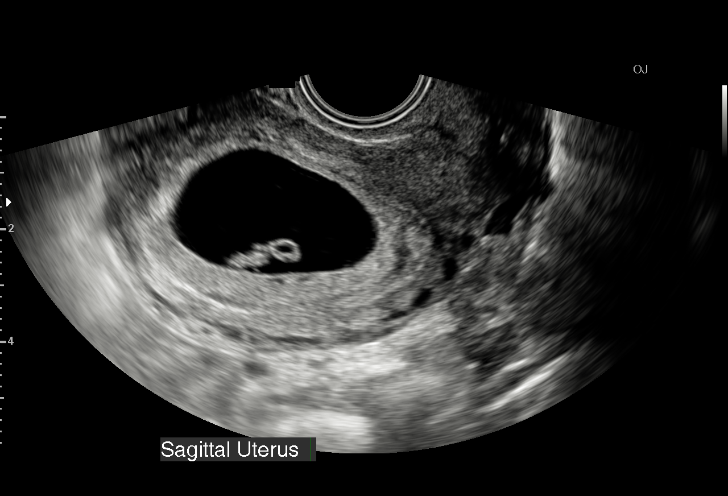
[im 7/32]
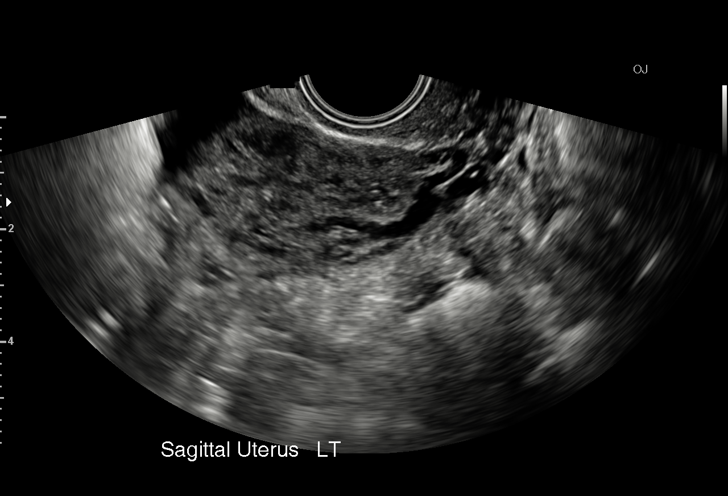
[im 10/32]
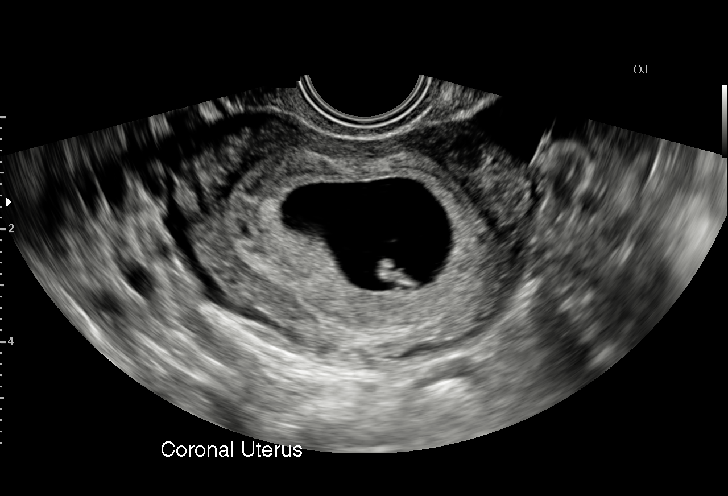
[im 12/32]
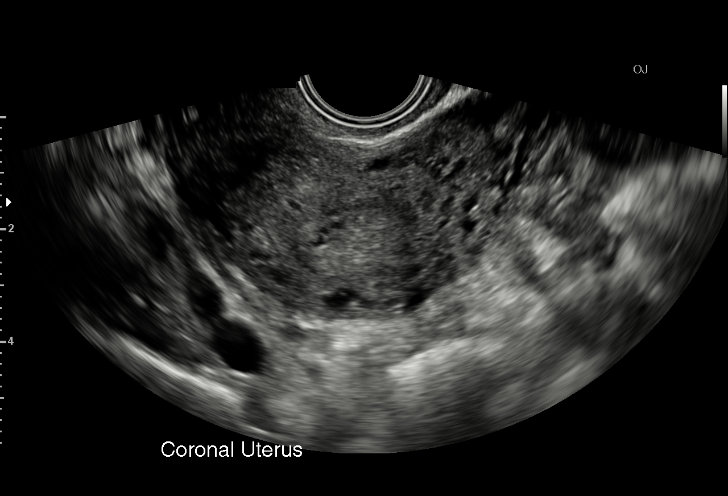
[im 14/32]
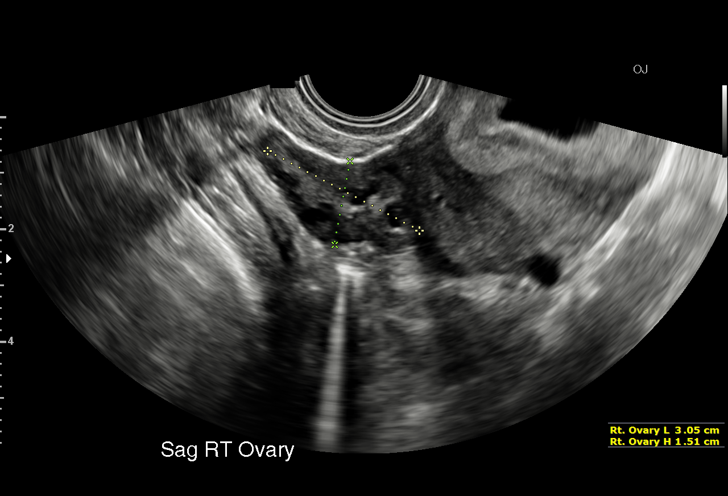
[im 17/32]
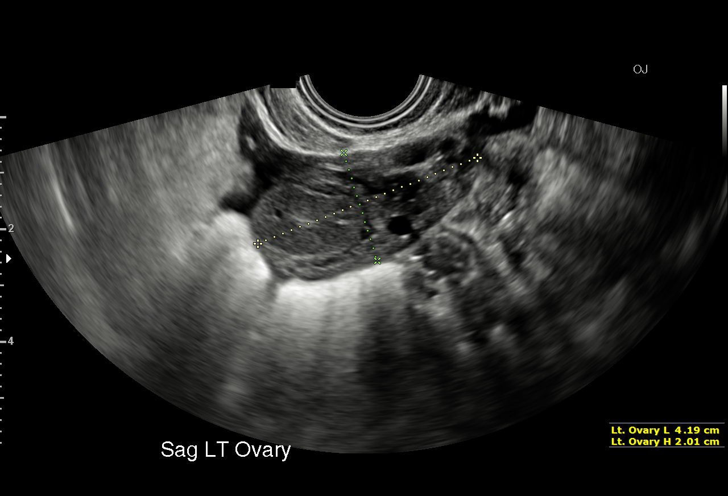
[im 18/32]
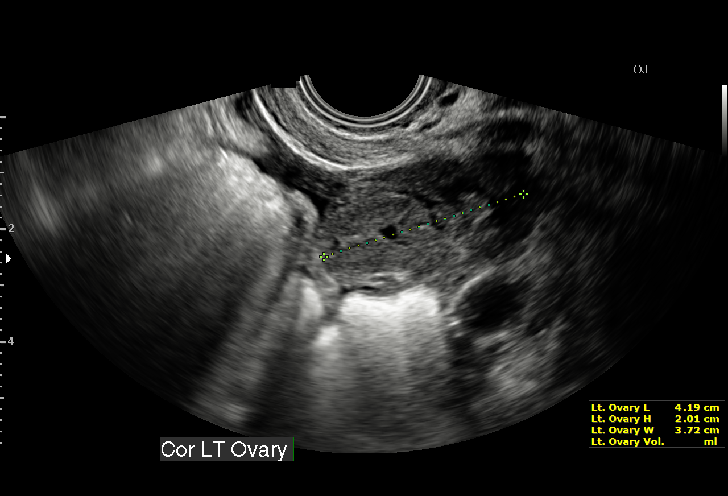
[im 20/32]
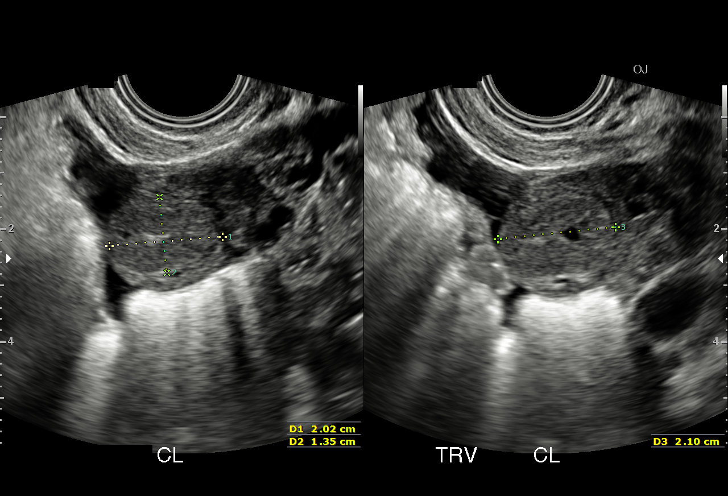
[im 22/32]
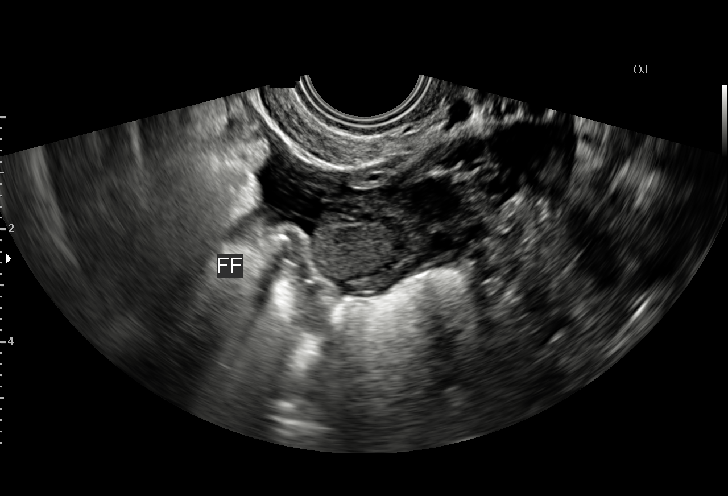
[im 25/32]
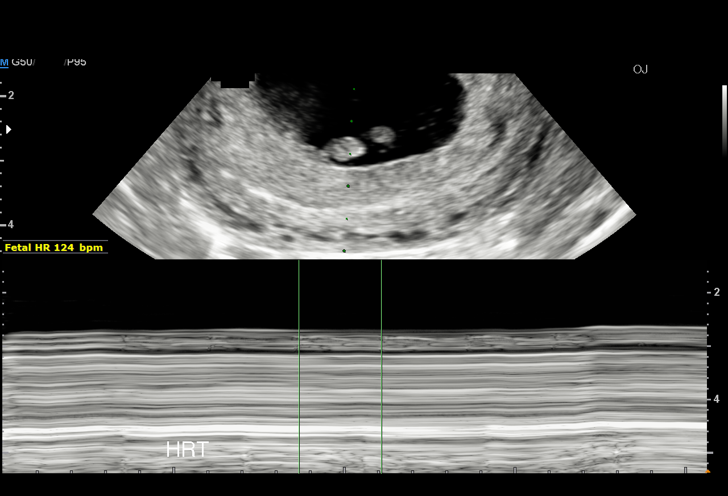
[im 27/32]
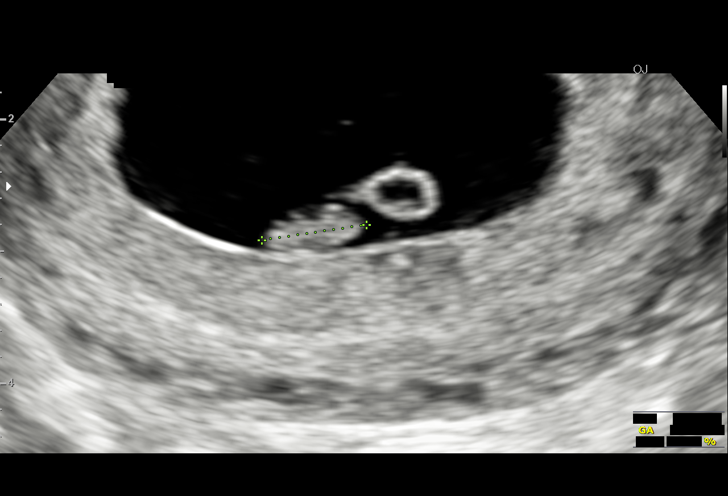
[im 29/32]
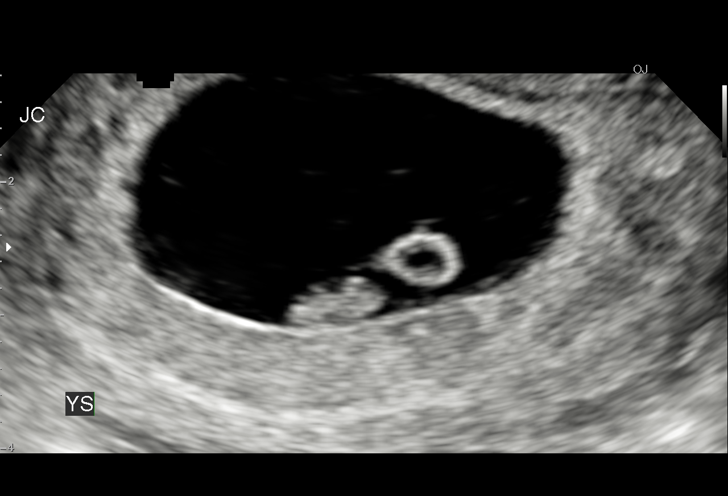
[im 32/32]
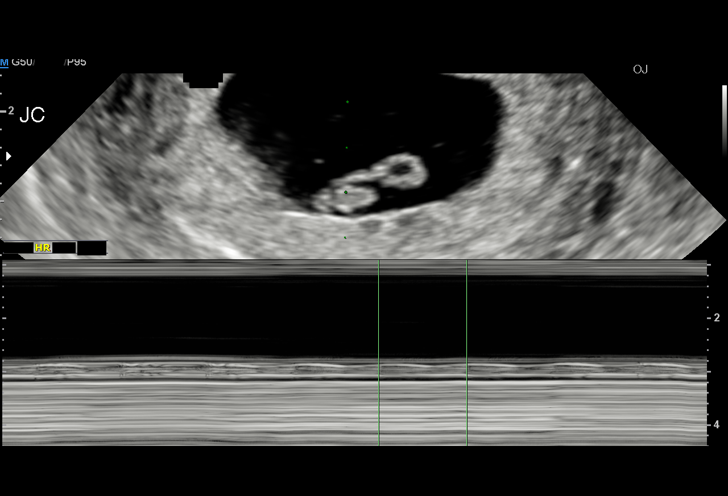

[15 of 28 positions shown; findings below may reference images not displayed]

FINDINGS: Intrauterine gestational sac: Visualized/normal in shape.

Yolk sac:  Present

Embryo:  Present

Cardiac Activity: Present

Heart Rate: Present bpm

CRL:   8.1  mm   6 w 5 d                  US EDC: 08/03/2016

Subchorionic hemorrhage:  None visualized.

Maternal uterus/adnexae: Ovaries have a normal appearance. Left
corpus luteum cyst is present. Trace free pelvic fluid is noted.
IMPRESSION: 1. Single living intrauterine embryo.
2. Size and dates correlate well. Today's exam confirms clinical EDC
of 08/02/2016.

## 2016-09-26 NOTE — Telephone Encounter (Signed)
Patient called and left message stating she is on the depo provera and she having bleeding and was told she wouldn't. Called patient back & discussed normal irregular bleeding with depo especially the first 2 injections. Patient verbalized understanding & had no questions

## 2016-11-26 ENCOUNTER — Ambulatory Visit: Payer: Medicaid Other

## 2016-12-04 ENCOUNTER — Ambulatory Visit: Payer: Medicaid Other

## 2017-04-06 ENCOUNTER — Ambulatory Visit (INDEPENDENT_AMBULATORY_CARE_PROVIDER_SITE_OTHER): Payer: Medicaid Other | Admitting: Family Medicine

## 2017-04-06 ENCOUNTER — Encounter: Payer: Self-pay | Admitting: Family Medicine

## 2017-04-06 VITALS — BP 120/65 | HR 83 | Ht 63.0 in | Wt 112.4 lb

## 2017-04-06 DIAGNOSIS — Z3202 Encounter for pregnancy test, result negative: Secondary | ICD-10-CM

## 2017-04-06 DIAGNOSIS — Z30017 Encounter for initial prescription of implantable subdermal contraceptive: Secondary | ICD-10-CM

## 2017-04-06 DIAGNOSIS — Z3049 Encounter for surveillance of other contraceptives: Secondary | ICD-10-CM

## 2017-04-06 DIAGNOSIS — N921 Excessive and frequent menstruation with irregular cycle: Secondary | ICD-10-CM

## 2017-04-06 LAB — POCT PREGNANCY, URINE: PREG TEST UR: NEGATIVE

## 2017-04-06 MED ORDER — ETONOGESTREL 68 MG ~~LOC~~ IMPL
68.0000 mg | DRUG_IMPLANT | Freq: Once | SUBCUTANEOUS | Status: AC
  Administered 2017-04-06: 68 mg via SUBCUTANEOUS

## 2017-04-06 NOTE — Progress Notes (Signed)
     GYNECOLOGY CLINIC PROCEDURE NOTE  Vita Reynaldo MiniumF Jun is a 31 y.o. G1P1001 here for Nexplanon insertion.  Last pap smear was on 01/09/2016 and was normal.  No other gynecologic concerns.  Nexplanon Insertion Procedure Patient identified, informed consent performed, consent signed.   Patient does understand that irregular bleeding is a very common side effect of this medication. She was advised to have backup contraception for one week after placement. Pregnancy test in clinic today was negative.  Appropriate time out taken.  Patient's left arm was prepped and draped in the usual sterile fashion.. The ruler used to measure and mark insertion area.  Patient was prepped with alcohol swab and then injected with 3 ml of 1% lidocaine.  She was prepped with betadine, Nexplanon removed from packaging,  Device confirmed in needle, then inserted full length of needle and withdrawn per handbook instructions. Nexplanon was able to palpated in the patient's arm; patient palpated the insert herself. There was minimal blood loss.  Patient insertion site covered with guaze and a pressure bandage to reduce any bruising.  The patient tolerated the procedure well and was given post procedure instructions.   Expiration date: 04/2019  Lot #: [id number]  Cleda ClarksELIZABETH W MUMAW, DO OB FELLOW Center for [facility], [facility]

## 2017-04-06 NOTE — Progress Notes (Signed)
   CLINIC ENCOUNTER NOTE  History:  31 y.o. G1P1001 here today for irregular bleeding and contraception management.  Patient had delivered a baby 7 months ago. Patient states that she missed her last Depo dose (was due in January), does not want to come back for injections every 3 months. She is currently separated from her husband, has not had intercourse for a while (>2 weeks). Pregnancy test is negative today.   Her bleeding started this month (last depo was injected 09/10/2016), and has been on/off, with light bleeding and spotting. She would prefer to not have periods and would like a longer acting birth control method. Reviewed all methods, with suggestion of IUD for bleeding control. Patient ultimately chose Nexplanon. Informed patient may or will likely cause irregular bleeding in the beginning after insertion, she states she is OK with that. Patient had regular menses prior to the pregnancy.    Past Medical History:  Diagnosis Date  . Medical history non-contributory     Past Surgical History:  Procedure Laterality Date  . TONSILLECTOMY      The following portions of the patient's history were reviewed and updated as appropriate: allergies, current medications, past family history, past medical history, past social history, past surgical history and problem list.     Review of Systems:  See above; comprehensive review of systems was otherwise negative.   Objective:  Physical Exam BP 120/65   Pulse 83   Ht 5\' 3"  (1.6 m)   Wt 112 lb 6.4 oz (51 kg)   Breastfeeding? Yes   BMI 19.91 kg/m  CONSTITUTIONAL: Well-developed, well-nourished female in no acute distress.  HENT:  Normocephalic, atraumatic SKIN: Skin is warm and dry.  NEUROLGIC: Alert  PSYCHIATRIC: Normal mood and affect.  CARDIOVASCULAR: Normal heart rate noted RESPIRATORY: Effort and breath sounds normal, no problems with respiration noted ABDOMEN: Soft, no distention noted.  No tenderness, rebound or guarding.        Assessment & Plan:   1. Irregular intermenstrual bleeding - Most likely due to stopping Depo - Educated that Nexplanon will likely cause more irregular or even heavier bleeding, patient understands and still would like it to be done today.  2. Encounter for initial prescription of implantable subdermal contraceptive - Nexplanon inserted today    Routine preventative health maintenance measures emphasized.     Jen MowElizabeth Yonael Tulloch, DO OB/GYN Fellow Center for [facility], [facility]

## 2017-04-06 NOTE — Patient Instructions (Signed)
Etonogestrel implant What is this medicine? ETONOGESTREL (et oh noe JES trel) is a contraceptive (birth control) device. It is used to prevent pregnancy. It can be used for up to 3 years. This medicine may be used for other purposes; ask your health care provider or pharmacist if you have questions. COMMON BRAND NAME(S): Implanon, Nexplanon What should I tell my health care provider before I take this medicine? They need to know if you have any of these conditions: -abnormal vaginal bleeding -blood vessel disease or blood clots -cancer of the breast, cervix, or liver -depression -diabetes -gallbladder disease -headaches -heart disease or recent heart attack -high blood pressure -high cholesterol -kidney disease -liver disease -renal disease -seizures -tobacco smoker -an unusual or allergic reaction to etonogestrel, other hormones, anesthetics or antiseptics, medicines, foods, dyes, or preservatives -pregnant or trying to get pregnant -breast-feeding How should I use this medicine? This device is inserted just under the skin on the inner side of your upper arm by a health care professional. Talk to your pediatrician regarding the use of this medicine in children. Special care may be needed. Overdosage: If you think you have taken too much of this medicine contact a poison control center or emergency room at once. NOTE: This medicine is only for you. Do not share this medicine with others. What if I miss a dose? This does not apply. What may interact with this medicine? Do not take this medicine with any of the following medications: -amprenavir -bosentan -fosamprenavir This medicine may also interact with the following medications: -barbiturate medicines for inducing sleep or treating seizures -certain medicines for fungal infections like ketoconazole and itraconazole -grapefruit juice -griseofulvin -medicines to treat seizures like carbamazepine, felbamate, oxcarbazepine,  phenytoin, topiramate -modafinil -phenylbutazone -rifampin -rufinamide -some medicines to treat HIV infection like atazanavir, indinavir, lopinavir, nelfinavir, tipranavir, ritonavir -St. John's wort This list may not describe all possible interactions. Give your health care provider a list of all the medicines, herbs, non-prescription drugs, or dietary supplements you use. Also tell them if you smoke, drink alcohol, or use illegal drugs. Some items may interact with your medicine. What should I watch for while using this medicine? This product does not protect you against HIV infection (AIDS) or other sexually transmitted diseases. You should be able to feel the implant by pressing your fingertips over the skin where it was inserted. Contact your doctor if you cannot feel the implant, and use a non-hormonal birth control method (such as condoms) until your doctor confirms that the implant is in place. If you feel that the implant may have broken or become bent while in your arm, contact your healthcare provider. What side effects may I notice from receiving this medicine? Side effects that you should report to your doctor or health care professional as soon as possible: -allergic reactions like skin rash, itching or hives, swelling of the face, lips, or tongue -breast lumps -changes in emotions or moods -depressed mood -heavy or prolonged menstrual bleeding -pain, irritation, swelling, or bruising at the insertion site -scar at site of insertion -signs of infection at the insertion site such as fever, and skin redness, pain or discharge -signs of pregnancy -signs and symptoms of a blood clot such as breathing problems; changes in vision; chest pain; severe, sudden headache; pain, swelling, warmth in the leg; trouble speaking; sudden numbness or weakness of the face, arm or leg -signs and symptoms of liver injury like dark yellow or brown urine; general ill feeling or flu-like symptoms;  light-colored   stools; loss of appetite; nausea; right upper belly pain; unusually weak or tired; yellowing of the eyes or skin -unusual vaginal bleeding, discharge -signs and symptoms of a stroke like changes in vision; confusion; trouble speaking or understanding; severe headaches; sudden numbness or weakness of the face, arm or leg; trouble walking; dizziness; loss of balance or coordination Side effects that usually do not require medical attention (report to your doctor or health care professional if they continue or are bothersome): -acne -back pain -breast pain -changes in weight -dizziness -general ill feeling or flu-like symptoms -headache -irregular menstrual bleeding -nausea -sore throat -vaginal irritation or inflammation This list may not describe all possible side effects. Call your doctor for medical advice about side effects. You may report side effects to FDA at 1-800-FDA-1088. Where should I keep my medicine? This drug is given in a hospital or clinic and will not be stored at home. NOTE: This sheet is a summary. It may not cover all possible information. If you have questions about this medicine, talk to your doctor, pharmacist, or health care provider.  2018 Elsevier/Gold Standard (2016-06-05 11:19:22)  

## 2017-04-06 NOTE — Progress Notes (Signed)
States not with her husband now, doesn't need birth control- wants something to stop her bleeding. Has been bleeding over a month.

## 2017-04-21 ENCOUNTER — Ambulatory Visit: Payer: Medicaid Other | Admitting: Medical

## 2018-03-02 ENCOUNTER — Encounter: Payer: Self-pay | Admitting: *Deleted

## 2018-05-19 ENCOUNTER — Other Ambulatory Visit: Payer: Self-pay

## 2018-05-19 ENCOUNTER — Ambulatory Visit (HOSPITAL_COMMUNITY)
Admission: EM | Admit: 2018-05-19 | Discharge: 2018-05-19 | Disposition: A | Discharge status: Home / Self Care | Payer: Medicaid Other | Attending: Internal Medicine | Admitting: Internal Medicine

## 2018-05-19 ENCOUNTER — Encounter (HOSPITAL_COMMUNITY): Payer: Self-pay | Admitting: Emergency Medicine

## 2018-05-19 DIAGNOSIS — H66002 Acute suppurative otitis media without spontaneous rupture of ear drum, left ear: Secondary | ICD-10-CM | POA: Diagnosis not present

## 2018-05-19 MED ORDER — AMOXICILLIN-POT CLAVULANATE 875-125 MG PO TABS: 1.0000 | ORAL_TABLET | Freq: Two times a day (BID) | ORAL | 0 refills | Status: AC

## 2018-05-19 NOTE — ED Provider Notes (Signed)
[facility]   [id number] 05/19/18 Arrival Time: 1123  SUBJECTIVE: History from: patient.  Dana Cantu is a 32 y.o. female who presents with of left ear pain that began yesterday.  Denies a precipitating event, such as swimming or wearing ear plugs.  Patient states the pain is constant and sharp in character.  Patient has tried tyelnol with relief.  Denies aggravating factors.  Denies similar symptoms in the past.   Complains of associated sore throat, and dental pain.  Denies fever, chills, fatigue, sinus pain, rhinorrhea, ear discharge, cough,  SOB, wheezing, chest pain, nausea, changes in bowel or bladder habits.    ROS: As per HPI.  Past Medical History:  Diagnosis Date  . Medical history non-contributory    Past Surgical History:  Procedure Laterality Date  . TONSILLECTOMY     No Known Allergies No current facility-administered medications on file prior to encounter.    Current Outpatient Medications on File Prior to Encounter  Medication Sig Dispense Refill  . acetaminophen (TYLENOL) 325 MG tablet Take 650 mg by mouth every 6 (six) hours as needed.    Marland Kitchen. ibuprofen (ADVIL,MOTRIN) 100 MG/5ML suspension Take 30 mLs (600 mg total) by mouth every 6 (six) hours. 473 mL 0  . loratadine (CLARITIN) 10 MG tablet Take 10 mg by mouth daily as needed for allergies.     Social History   Socioeconomic History  . Marital status: Married    Spouse name: Not on file  . Number of children: Not on file  . Years of education: Not on file  . Highest education level: Not on file  Occupational History  . Not on file  Social Needs  . Financial resource strain: Not on file  . Food insecurity:    Worry: Not on file    Inability: Not on file  . Transportation needs:    Medical: Not on file    Non-medical: Not on file  Tobacco Use  . Smoking status: Never Smoker  . Smokeless tobacco: Never Used  Substance and Sexual Activity  . Alcohol use: No    Alcohol/week: 0.0 oz  . Drug  use: No  . Sexual activity: Not Currently    Birth control/protection: Implant  Lifestyle  . Physical activity:    Days per week: Not on file    Minutes per session: Not on file  . Stress: Not on file  Relationships  . Social connections:    Talks on phone: Not on file    Gets together: Not on file    Attends religious service: Not on file    Active member of club or organization: Not on file    Attends meetings of clubs or organizations: Not on file    Relationship status: Not on file  . Intimate partner violence:    Fear of current or ex partner: Not on file    Emotionally abused: Not on file    Physically abused: Not on file    Forced sexual activity: Not on file  Other Topics Concern  . Not on file  Social History Narrative  . Not on file   Family History  Problem Relation Age of Onset  . Diabetes Father     OBJECTIVE:  Vitals:   05/19/18 1147  BP: 101/65  Pulse: 77  Resp: 16  Temp: 98.9 F (37.2 C)  TempSrc: Oral  SpO2: 100%     General appearance: alert and active; nontoxic appearance; appears stated age HEENT: Ears: EACs clear,  RT TM pearly gray with visible cone of light, without erythema; LT TM erythematous and injected without pain with tragal manipulation; Eyes: PERRL, EOMI grossly; Sinuses nontender to palpation; Nose: no rhinorrhea; Throat: oropharynx clear, tonsils and uvula absent; Mouth: dentition good without obvious dental caries, gingivitis, ulcers, lesions or active bleeding, nontender with tongue depressor over teeth on left side of mouth Neck: Left sided LAD Lungs: CTA bilaterally without adventitious breath sounds Heart: regular rate and rhythm.  Radial pulses 2+ symmetrical bilaterally Skin: warm and dry Psychological: alert and cooperative; normal mood and affect   ASSESSMENT & PLAN:  1. Non-recurrent acute suppurative otitis media of left ear without spontaneous rupture of tympanic membrane     Meds ordered this encounter    Medications  . amoxicillin-clavulanate (AUGMENTIN) 875-125 MG tablet    Sig: Take 1 tablet by mouth every 12 (twelve) hours for 10 days.    Dispense:  20 tablet    Refill:  0    Order Specific Question:   Supervising Provider    Answer:   Isa Rankin [[id number]]    Rest and drink plenty of fluids Prescribed augmentin 875 for 7-10 days Take medication as directed and to completion Continue to use OTC ibuprofen and/ or tylenol as needed for pain control Follow up with PCP if symptoms persists Return here or go to the ER if you have any new or worsening symptoms   Reviewed expectations re: course of current medical issues. Questions answered. Outlined signs and symptoms indicating need for more acute intervention. Patient verbalized understanding. After Visit Summary given.         Rennis Harding, PA-C 05/19/18 1210

## 2018-05-19 NOTE — ED Triage Notes (Signed)
Left ear pain, left jaw, left face, left neck pain-onset yesterday.

## 2018-05-19 NOTE — Discharge Instructions (Addendum)
Rest and drink plenty of fluids Prescribed augmentin 875 for 10 days Take medication as directed and to completion Continue to use OTC ibuprofen and/ or tylenol as needed for pain control Follow up with PCP if symptoms persists Return here or go to the ER if you have any new or worsening symptoms

## 2019-03-08 ENCOUNTER — Ambulatory Visit: Payer: Medicaid Other | Admitting: Medical

## 2019-04-20 ENCOUNTER — Ambulatory Visit: Payer: Medicaid Other

## 2019-05-30 ENCOUNTER — Telehealth: Payer: Self-pay | Admitting: Family Medicine

## 2019-05-30 NOTE — Telephone Encounter (Signed)
Called to get patient rescheduled for a Nexplanon removal. Was not able to leave a message.

## 2020-03-16 ENCOUNTER — Telehealth: Payer: Self-pay | Admitting: *Deleted

## 2020-03-16 NOTE — Telephone Encounter (Signed)
Patient called requesting information for previous OBGYN contact information.Given telephone number and hours of office in chart .

## 2020-05-07 ENCOUNTER — Other Ambulatory Visit: Payer: Self-pay

## 2020-05-07 ENCOUNTER — Encounter: Payer: Self-pay | Admitting: Nurse Practitioner

## 2020-05-07 ENCOUNTER — Ambulatory Visit (INDEPENDENT_AMBULATORY_CARE_PROVIDER_SITE_OTHER): Payer: Medicaid Other | Admitting: Nurse Practitioner

## 2020-05-07 VITALS — BP 122/76 | HR 74 | Temp 98.4°F | Wt 129.0 lb

## 2020-05-07 DIAGNOSIS — Z3042 Encounter for surveillance of injectable contraceptive: Secondary | ICD-10-CM

## 2020-05-07 DIAGNOSIS — Z3046 Encounter for surveillance of implantable subdermal contraceptive: Secondary | ICD-10-CM

## 2020-05-07 DIAGNOSIS — Z30013 Encounter for initial prescription of injectable contraceptive: Secondary | ICD-10-CM | POA: Diagnosis not present

## 2020-05-07 MED ORDER — MEDROXYPROGESTERONE ACETATE 150 MG/ML IM SUSP
150.0000 mg | Freq: Once | INTRAMUSCULAR | Status: AC
  Administered 2020-05-07: 150 mg via INTRAMUSCULAR

## 2020-05-07 NOTE — Progress Notes (Signed)
     GYNECOLOGY OFFICE PROCEDURE NOTE  Dana Cantu is a 34 y.o. G1P1001 here for Nexplanon removal due to it being expired.  Wants Depo and will give today.  No intercourse for the past year - no current partner.  Did not do pregnancy test. Last pap smear was on 01-2016 and was normal.  No other gynecologic concerns.  Nexplanon Removal Patient identified, informed consent performed, consent signed.   Appropriate time out taken. Nexplanon site identified.  Area prepped in usual sterile fashon. Three ml of 1% lidocaine was used to anesthetize the area at the distal end of the implant. A small stab incision was made right beside the implant on the distal portion.  The Nexplanon rod was grasped using hemostats and removed without difficulty.  There was minimal blood loss. There were no complications.  Steri-strips were applied over the small incision.  A pressure bandage was applied to reduce any bruising.  The patient tolerated the procedure well and was given post procedure instructions.  Patient is planning to use Depo for contraception.  Message sent to client as pap smear is overdue.  Noted after patient left.  Nolene Bernheim, RN, MSN, NP-BC Nurse Practitioner, [facility] for [employer], [facility] 05/07/2020 4:06 PM

## 2020-05-28 ENCOUNTER — Encounter: Payer: Self-pay | Admitting: *Deleted

## 2020-07-24 ENCOUNTER — Ambulatory Visit: Payer: Medicaid Other

## 2020-09-19 ENCOUNTER — Encounter (HOSPITAL_COMMUNITY): Payer: Self-pay | Admitting: Emergency Medicine

## 2020-09-19 ENCOUNTER — Other Ambulatory Visit: Payer: Self-pay

## 2020-09-19 ENCOUNTER — Ambulatory Visit (HOSPITAL_COMMUNITY)
Admission: EM | Admit: 2020-09-19 | Discharge: 2020-09-19 | Disposition: A | Discharge status: Home / Self Care | Payer: Medicaid Other | Attending: Family Medicine | Admitting: Family Medicine

## 2020-09-19 DIAGNOSIS — U071 COVID-19: Secondary | ICD-10-CM | POA: Diagnosis not present

## 2020-09-19 DIAGNOSIS — Z20822 Contact with and (suspected) exposure to covid-19: Secondary | ICD-10-CM | POA: Diagnosis not present

## 2020-09-19 MED ORDER — IBUPROFEN 800 MG PO TABS: 800.0000 mg | ORAL_TABLET | Freq: Three times a day (TID) | ORAL | 0 refills | Status: AC

## 2020-09-19 MED ORDER — ACETAMINOPHEN 325 MG PO TABS
ORAL_TABLET | ORAL | Status: AC
Start: 2020-09-19 — End: ?
  Filled 2020-09-19: qty 1

## 2020-09-19 MED ORDER — BENZONATATE 200 MG PO CAPS: 200.0000 mg | ORAL_CAPSULE | Freq: Three times a day (TID) | ORAL | 0 refills | Status: AC | PRN

## 2020-09-19 MED ORDER — ACETAMINOPHEN 325 MG PO TABS
650.0000 mg | ORAL_TABLET | Freq: Once | ORAL | Status: AC
  Administered 2020-09-19: 650 mg via ORAL

## 2020-09-19 NOTE — ED Triage Notes (Signed)
Onset monday of sore throat , then fever, followed by headache.  Has had chills

## 2020-09-19 NOTE — Discharge Instructions (Signed)
Rest and fluids Alternate Tylenol and ibuprofen every 4 hours to keep fever controlled, help with body aches and headache Tessalon for cough May use other over-the-counter medicine as needed for symptoms Follow-up if not improving or worsening

## 2020-09-19 NOTE — ED Provider Notes (Signed)
[facility]    CSN: [id number] Arrival date & time: 09/19/20  1616      History   Chief Complaint Chief Complaint  Patient presents with  . Cough    HPI Dana Cantu is a 34 y.o. female presenting today for evaluation of fever and URI symptoms.  Patient reports beginning over the past 3 to 4 days she has developed sore throat cough congestion as well as fevers headaches and body aches.  Reports positive COVID exposure in the household from her sister.  She denies any chest pain or shortness of breath.  HPI  Past Medical History:  Diagnosis Date  . Medical history non-contributory     Patient Active Problem List   Diagnosis Date Noted  . Depo-Provera contraceptive status 09/10/2016    Past Surgical History:  Procedure Laterality Date  . TONSILLECTOMY      OB History    Gravida  1   Para  1   Term  1   Preterm      AB      Living  1     SAB      TAB      Ectopic      Multiple  0   Live Births  1            Home Medications    Prior to Admission medications   Medication Sig Start Date End Date Taking? Authorizing Provider  acetaminophen (TYLENOL) 325 MG tablet Take 650 mg by mouth every 6 (six) hours as needed.   Yes [provider]  benzonatate (TESSALON) 200 MG capsule Take 1 capsule (200 mg total) by mouth 3 (three) times daily as needed for up to 7 days for cough. 09/19/20 09/26/20  Kadience Macchi C, PA-C  ibuprofen (ADVIL) 800 MG tablet Take 1 tablet (800 mg total) by mouth 3 (three) times daily. 09/19/20   Tomoya Ringwald C, PA-C  loratadine (CLARITIN) 10 MG tablet Take 10 mg by mouth daily as needed for allergies.    [provider]    Family History Family History  Problem Relation Age of Onset  . Diabetes Father     Social History Social History   Tobacco Use  . Smoking status: Never Smoker  . Smokeless tobacco: Never Used  Substance Use Topics  . Alcohol use: No    Alcohol/week: 0.0  standard drinks  . Drug use: No     Allergies   Patient has no known allergies.   Review of Systems Review of Systems  Constitutional: Positive for fever. Negative for activity change, appetite change, chills and fatigue.  HENT: Positive for congestion, rhinorrhea, sinus pressure and sore throat. Negative for ear pain and trouble swallowing.   Eyes: Negative for discharge and redness.  Respiratory: Positive for cough. Negative for chest tightness and shortness of breath.   Cardiovascular: Negative for chest pain.  Gastrointestinal: Negative for abdominal pain, diarrhea, nausea and vomiting.  Musculoskeletal: Negative for myalgias.  Skin: Negative for rash.  Neurological: Positive for headaches. Negative for dizziness and light-headedness.     Physical Exam Triage Vital Signs ED Triage Vitals  Enc Vitals Group     BP 09/19/20 1817 116/80     Pulse Rate 09/19/20 1817 (!) 104     Resp 09/19/20 1817 (!) 22     Temp 09/19/20 1817 (!) 101.2 F (38.4 C)     Temp Source 09/19/20 1817 Oral     SpO2 09/19/20 1817 100 %  Weight --      Height --      Head Circumference --      Peak Flow --      Pain Score 09/19/20 1814 8     Pain Loc --      Pain Edu? --      Excl. in GC? --    No data found.  Updated Vital Signs BP 116/80 (BP Location: Right Arm)   Pulse (!) 104   Temp (!) 101.2 F (38.4 C) (Oral)   Resp (!) 22   SpO2 100%   Visual Acuity Right Eye Distance:   Left Eye Distance:   Bilateral Distance:    Right Eye Near:   Left Eye Near:    Bilateral Near:     Physical Exam Vitals and nursing note reviewed.  Constitutional:      Appearance: She is well-developed.     Comments: No acute distress  HENT:     Head: Normocephalic and atraumatic.     Ears:     Comments: Bilateral ears without tenderness to palpation of external auricle, tragus and mastoid, EAC's without erythema or swelling, TM's with good bony landmarks and cone of light. Non erythematous.      Nose: Nose normal.     Mouth/Throat:     Comments: Oral mucosa pink and moist, no tonsillar enlargement or exudate. Posterior pharynx patent and nonerythematous, no uvula deviation or swelling. Normal phonation. Eyes:     Conjunctiva/sclera: Conjunctivae normal.  Cardiovascular:     Rate and Rhythm: Normal rate.  Pulmonary:     Effort: Pulmonary effort is normal. No respiratory distress.     Comments: Breathing comfortably at rest, CTABL, no wheezing, rales or other adventitious sounds auscultated Abdominal:     General: There is no distension.  Musculoskeletal:        General: Normal range of motion.     Cervical back: Neck supple.  Skin:    General: Skin is warm and dry.  Neurological:     Mental Status: She is alert and oriented to person, place, and time.      UC Treatments / Results  Labs (all labs ordered are listed, but only abnormal results are displayed) Labs Reviewed  SARS CORONAVIRUS 2 (TAT 6-24 HRS) - Abnormal; Notable for the following components:      Result Value   SARS Coronavirus 2 POSITIVE (*)    All other components within normal limits    EKG   Radiology No results found.  Procedures Procedures (including critical care time)  Medications Ordered in UC Medications  acetaminophen (TYLENOL) tablet 650 mg (650 mg Oral Given 09/19/20 1826)    Initial Impression / Assessment and Plan / UC Course  I have reviewed the triage vital signs and the nursing notes.  Pertinent labs & imaging results that were available during my care of the patient were reviewed by me and considered in my medical decision making (see chart for details).     Covid test pending, given close exposure high suspicion of Covid; recommending symptomatic and supportive care.  Rest and fluids.  Discussed strict return precautions. Patient verbalized understanding and is agreeable with plan.  Final Clinical Impressions(s) / UC Diagnoses   Final diagnoses:  Suspected COVID-19  virus infection     Discharge Instructions     Rest and fluids Alternate Tylenol and ibuprofen every 4 hours to keep fever controlled, help with body aches and headache Tessalon for cough May use other over-the-counter  medicine as needed for symptoms Follow-up if not improving or worsening    ED Prescriptions    Medication Sig Dispense Auth. Provider   benzonatate (TESSALON) 200 MG capsule Take 1 capsule (200 mg total) by mouth 3 (three) times daily as needed for up to 7 days for cough. 28 capsule Robley Matassa C, PA-C   ibuprofen (ADVIL) 800 MG tablet Take 1 tablet (800 mg total) by mouth 3 (three) times daily. 21 tablet Crestina Strike, [place] C, PA-C     PDMP not reviewed this encounter.   Lew Dawes, [place] 09/20/20 [phone]

## 2020-09-20 LAB — SARS CORONAVIRUS 2 (TAT 6-24 HRS): SARS Coronavirus 2: POSITIVE — AB

## 2020-09-21 ENCOUNTER — Telehealth: Payer: Self-pay | Admitting: Nurse Practitioner

## 2020-09-21 NOTE — Telephone Encounter (Signed)
Called to Discuss with patient about Covid symptoms and the use of the monoclonal antibody infusion for those with mild to moderate Covid symptoms and at a high risk of hospitalization.     Pt appears to qualify for this infusion due to co-morbid conditions and/or a member of an at-risk group in accordance with the FDA Emergency Use Authorization.    Unable to reach pt. Voicemail left and My Chart message sent.   Nikki Pickenpack-Cousar, NP [facility]  [phone]    

## 2021-07-09 ENCOUNTER — Other Ambulatory Visit: Payer: Self-pay

## 2021-07-09 ENCOUNTER — Encounter: Payer: Self-pay | Admitting: Nurse Practitioner

## 2021-07-09 ENCOUNTER — Ambulatory Visit (INDEPENDENT_AMBULATORY_CARE_PROVIDER_SITE_OTHER): Payer: Medicaid Other | Admitting: Nurse Practitioner

## 2021-07-09 ENCOUNTER — Other Ambulatory Visit (HOSPITAL_COMMUNITY)
Admission: RE | Admit: 2021-07-09 | Discharge: 2021-07-09 | Disposition: A | Discharge status: Home / Self Care | Payer: Medicaid Other | Source: Ambulatory Visit | Attending: Nurse Practitioner | Admitting: Nurse Practitioner

## 2021-07-09 VITALS — BP 110/78 | HR 76 | Wt 125.6 lb

## 2021-07-09 DIAGNOSIS — Z01419 Encounter for gynecological examination (general) (routine) without abnormal findings: Secondary | ICD-10-CM | POA: Diagnosis present

## 2021-07-09 DIAGNOSIS — Z3042 Encounter for surveillance of injectable contraceptive: Secondary | ICD-10-CM | POA: Diagnosis present

## 2021-07-09 DIAGNOSIS — R55 Syncope and collapse: Secondary | ICD-10-CM

## 2021-07-09 LAB — POCT PREGNANCY, URINE: Preg Test, Ur: NEGATIVE

## 2021-07-09 MED ORDER — MEDROXYPROGESTERONE ACETATE 150 MG/ML IM SUSP
150.0000 mg | Freq: Once | INTRAMUSCULAR | Status: AC
  Administered 2021-07-09: 150 mg via INTRAMUSCULAR

## 2021-07-09 NOTE — Progress Notes (Signed)
CMA gave patient Depo injection on right deltoid. Patient tolerated well and informed to schedule 3 months for routine depo injection.

## 2021-07-09 NOTE — Progress Notes (Signed)
GYNECOLOGY ANNUAL PREVENTATIVE CARE ENCOUNTER NOTE  Subjective:   Dana Cantu is a 35 y.o. G31P1001 female here for a routine annual gynecologic exam.  Current complaints: feels faint sometimes when she is standing and closes her eyes.   Denies abnormal vaginal bleeding, discharge, pelvic pain, problems with intercourse or other gynecologic concerns.    Gynecologic History Patient's last menstrual period was 07/05/2021. Contraception: none Last Pap: 2017. Results were: normal   Obstetric History OB History  Gravida Para Term Preterm AB Living  1 1 1     1   SAB IAB Ectopic Multiple Live Births        0 1    # Outcome Date GA Lbr Len/2nd Weight Sex Delivery Anes PTL Lv  1 Term 08/09/16 [redacted]w[redacted]d 18:09 / 02:29 7 lb 11.5 oz (3.5 kg) F Vag-Spont None  LIV    Past Medical History:  Diagnosis Date   Medical history non-contributory     Past Surgical History:  Procedure Laterality Date   TONSILLECTOMY      Current Outpatient Medications on File Prior to Visit  Medication Sig Dispense Refill   acetaminophen (TYLENOL) 325 MG tablet Take 650 mg by mouth every 6 (six) hours as needed.     ibuprofen (ADVIL) 800 MG tablet Take 1 tablet (800 mg total) by mouth 3 (three) times daily. 21 tablet 0   No current facility-administered medications on file prior to visit.    No Known Allergies  Social History   Socioeconomic History   Marital status: Married    Spouse name: Not on file   Number of children: Not on file   Years of education: Not on file   Highest education level: Not on file  Occupational History   Not on file  Tobacco Use   Smoking status: Never   Smokeless tobacco: Never  Substance and Sexual Activity   Alcohol use: No    Alcohol/week: 0.0 standard drinks   Drug use: No   Sexual activity: Not Currently    Birth control/protection: None  Other Topics Concern   Not on file  Social History Narrative   Not on file   Social Determinants of Health    Financial Resource Strain: Not on file  Food Insecurity: No Food Insecurity   Worried About Running Out of Food in the Last Year: Never true   Ran Out of Food in the Last Year: Never true  Transportation Needs: No Transportation Needs   Lack of Transportation (Medical): No   Lack of Transportation (Non-Medical): No  Physical Activity: Not on file  Stress: Not on file  Social Connections: Not on file  Intimate Partner Violence: Not on file    Family History  Problem Relation Age of Onset   Diabetes Father     The following portions of the patient's history were reviewed and updated as appropriate: allergies, current medications, past family history, past medical history, past social history, past surgical history and problem list.  Review of Systems Pertinent items noted in HPI and remainder of comprehensive ROS otherwise negative.   Objective:  BP 110/78   Pulse 76   Wt 125 lb 9.6 oz (57 kg)   LMP 07/05/2021   BMI 22.25 kg/m  CONSTITUTIONAL: Well-developed, well-nourished female in no acute distress.  HENT:  Normocephalic, atraumatic, External right and left ear normal.  EYES: Conjunctivae and EOM are normal. Pupils are equal, round.  No scleral icterus.  NECK: Normal range of motion, supple, no masses.  Normal thyroid.  SKIN: Skin is warm and dry. No rash noted. Not diaphoretic. No erythema. No pallor. NEUROLOGIC: Alert and oriented to person, place, and time. Normal reflexes, muscle tone coordination. No cranial nerve deficit noted. PSYCHIATRIC: Normal mood and affect. Normal behavior. Normal judgment and thought content. CARDIOVASCULAR: Normal heart rate noted, regular rhythm RESPIRATORY: Clear to auscultation bilaterally. Effort and breath sounds normal, no problems with respiration noted. BREASTS: Symmetric in size. No masses, skin changes, nipple drainage, or lymphadenopathy. ABDOMEN: Soft, no distention noted.  No tenderness, rebound or guarding.  PELVIC: Normal  appearing external genitalia; normal appearing vaginal mucosa and cervix.  No abnormal discharge noted.  Blood from ending menses noted.  Pap smear obtained. Bimanual limited due to patient's tense muscles and anxiety about the exam. MUSCULOSKELETAL: Normal range of motion. No tenderness.  No cyanosis, clubbing, or edema.    Assessment and Plan:  1. Encounter for annual routine gynecological examination Very nervous with pap.  Tense with bimanual and exam limited due to inability to relax.  Advised next exam may be easier.  She voiced that this exam was better than she expected it to be.  - Cytology - PAP( [facility])  2. Encounter for Depo-Provera contraception Will start Depo today.  Has used before and did like it but did not know for most women, the period will stop.  Ending menses today. Return in 3 months for Depo  - Cytology - PAP( [facility]) - medroxyPROGESTERone (DEPO-PROVERA) injection 150 mg  3. Near syncope Feels like she will pass out when she is standing and closes her eyes. Drink at least 8 8-oz glasses of water every day. Make sure to eat protein at every meal - currently drinking coffee and not water.  Currently does not eat breakfast.  Advised to make changes in hydration and protein intake to feel better.  Will follow up results of pap smear and manage accordingly. Routine preventative health maintenance measures emphasized. Please refer to After Visit Summary for other counseling recommendations.    Nolene Bernheim, RN, MSN, NP-BC Nurse Practitioner, [facility]

## 2021-07-12 LAB — CYTOLOGY - PAP
Comment: NEGATIVE
Diagnosis: NEGATIVE
High risk HPV: NEGATIVE

## 2021-10-03 ENCOUNTER — Ambulatory Visit: Payer: Medicaid Other

## 2021-10-08 ENCOUNTER — Ambulatory Visit: Payer: Medicaid Other

## 2024-02-05 ENCOUNTER — Other Ambulatory Visit: Payer: Self-pay | Admitting: Family

## 2024-02-05 ENCOUNTER — Inpatient Hospital Stay: Payer: Medicaid Other | Admitting: Family

## 2024-02-05 ENCOUNTER — Encounter: Payer: Self-pay | Admitting: Family

## 2024-02-05 ENCOUNTER — Inpatient Hospital Stay: Payer: Medicaid Other | Attending: Hematology & Oncology

## 2024-02-05 VITALS — BP 117/66 | HR 68 | Temp 98.9°F | Resp 16 | Ht 63.0 in | Wt 124.4 lb

## 2024-02-05 DIAGNOSIS — D709 Neutropenia, unspecified: Secondary | ICD-10-CM

## 2024-02-05 LAB — CMP (CANCER CENTER ONLY)
ALT: 8 U/L (ref 0–44)
AST: 15 U/L (ref 15–41)
Albumin: 4.4 g/dL (ref 3.5–5.0)
Alkaline Phosphatase: 37 U/L — ABNORMAL LOW (ref 38–126)
Anion gap: 7 (ref 5–15)
BUN: 9 mg/dL (ref 6–20)
CO2: 28 mmol/L (ref 22–32)
Calcium: 9.3 mg/dL (ref 8.9–10.3)
Chloride: 104 mmol/L (ref 98–111)
Creatinine: 0.6 mg/dL (ref 0.44–1.00)
GFR, Estimated: >60 mL/min (ref >60)
Glucose, Bld: 113 mg/dL — ABNORMAL HIGH (ref 70–99)
Potassium: 4.2 mmol/L (ref 3.5–5.1)
Sodium: 139 mmol/L (ref 135–145)
Total Bilirubin: 1 mg/dL (ref 0.0–1.2)
Total Protein: 6.9 g/dL (ref 6.5–8.1)

## 2024-02-05 LAB — CBC WITH DIFFERENTIAL (CANCER CENTER ONLY)
Abs Immature Granulocytes: 0 10*3/uL (ref 0.00–0.07)
Basophils Absolute: 0 10*3/uL (ref 0.0–0.1)
Basophils Relative: 1 %
Eosinophils Absolute: 0.1 10*3/uL (ref 0.0–0.5)
Eosinophils Relative: 4 %
HCT: 39.9 % (ref 36.0–46.0)
Hemoglobin: 13.6 g/dL (ref 12.0–15.0)
Immature Granulocytes: 0 %
Lymphocytes Relative: 43 %
Lymphs Abs: 1.6 10*3/uL (ref 0.7–4.0)
MCH: 31 pg (ref 26.0–34.0)
MCHC: 34.1 g/dL (ref 30.0–36.0)
MCV: 90.9 fL (ref 80.0–100.0)
Monocytes Absolute: 0.4 10*3/uL (ref 0.1–1.0)
Monocytes Relative: 10 %
Neutro Abs: 1.5 10*3/uL — ABNORMAL LOW (ref 1.7–7.7)
Neutrophils Relative %: 42 %
Platelet Count: 201 10*3/uL (ref 150–400)
RBC: 4.39 MIL/uL (ref 3.87–5.11)
RDW: 12 % (ref 11.5–15.5)
WBC Count: 3.6 10*3/uL — ABNORMAL LOW (ref 4.0–10.5)
nRBC: 0 % (ref 0.0–0.2)

## 2024-02-05 LAB — LACTATE DEHYDROGENASE: LDH: 118 U/L (ref 98–192)

## 2024-02-05 LAB — SAVE SMEAR(SSMR), FOR PROVIDER SLIDE REVIEW

## 2024-02-05 NOTE — Progress Notes (Signed)
 Hematology/Oncology Consultation   Name: Dana Cantu      MRN: [id number]    Location: Room/bed info not found  Date: 02/05/2024 Time:9:22 AM   REFERRING PHYSICIAN: Prudy Feeler, PA-C  REASON FOR CONSULT: Neutropenia    DIAGNOSIS: Mild benign familial neutropenia   HISTORY OF PRESENT ILLNESS: Ms. Dana Cantu is a very pleasant 38 yo African American female with history of intermittent mild neutropenia.  She has had no issues with frequent or recurrent infections.  WBC count today is 3.6, neutrophils 42% and lymphocytes 43%. No anemia noted. Platelets are within normal limits.  No c/o fatigue.  She has 1 daughter and no history of miscarriage.  Since having her daughter, she states that she had random itchy rashes occur all over her skin. She states that this was de to environmental allergens and taking Zyrtec daily has helped control her symptoms.  She has history of a heart murmer and sees cardiology (Dr. Sharyn Lull) every 4 months. She also takes Crestor for hyperlipidemia.  She states that up until the last year or so she has had a very irregular cycle only occurring every 3-5 months. She states that her flow is normal.  No other blood loss. No abnormal bruising, no petechiae.  No fever, chills, n/v, cough, rash, dizziness, SOB, chest pain, palpitations, abdominal pain or changes in bowel or bladder habits.  She has some occasional abdominal bloating with certain foods.  She has GERD but stopped taking her Prilosec due to dizziness as a side effect.  No history of diabetes or thyroid disease.  She had her uvula removed while still living in [place] and then had her tosillectomy at age 36 after moving to the [place].  She was born in [place] and raised in [place] before moving to the [place] in her late teens.  No personal history of cancer. Both of her grandmothers had ovarian cancer.  No swelling, tenderness, numbness or tingling in her extremities at this time. She notes intermittent lower back pain with  sciatica mostly effecting the right leg and occasionally the left.   No falls or syncope reported.  No smoking, ETOH or recreational drug use.  Appetite and hydration are good. She does eat meat. Weight is stable at 124 lbs.  She stays busy with her daughter and sometimes works for [employer].   ROS: All other 10 point review of systems is negative.   PAST MEDICAL HISTORY:   Past Medical History:  Diagnosis Date   Medical history non-contributory     ALLERGIES: No Known Allergies    MEDICATIONS:  Current Outpatient Medications on File Prior to Visit  Medication Sig Dispense Refill   acetaminophen (TYLENOL) 325 MG tablet Take 650 mg by mouth every 6 (six) hours as needed. (Patient not taking: Reported on 02/05/2024)     cetirizine (ZYRTEC) 10 MG tablet Take 1 tablet by mouth at bedtime.     famotidine (PEPCID) 20 MG tablet Take 1 tablet by mouth 2 (two) times daily.     ibuprofen (ADVIL) 800 MG tablet Take 1 tablet (800 mg total) by mouth 3 (three) times daily. (Patient not taking: Reported on 02/05/2024) 21 tablet 0   rosuvastatin (CRESTOR) 10 MG tablet Take 1 tablet by mouth at bedtime.     No current facility-administered medications on file prior to visit.     PAST SURGICAL HISTORY Past Surgical History:  Procedure Laterality Date   TONSILLECTOMY      FAMILY HISTORY: Family History  Problem Relation Age  of Onset   Diabetes Father     SOCIAL HISTORY:  reports that she has never smoked. She has never used smokeless tobacco. She reports that she does not drink alcohol and does not use drugs.  PERFORMANCE STATUS: The patient's performance status is 0 - Asymptomatic  PHYSICAL EXAM: Most Recent Vital Signs: Blood pressure 117/66, pulse 68, temperature 98.9 F (37.2 C), temperature source Oral, resp. rate 16, height 5\' 3"  (1.6 m), weight 124 lb 6.4 oz (56.4 kg), SpO2 100%, currently breastfeeding. BP 117/66 (BP Location: Left Arm, Patient Position: Sitting, Cuff Size: Normal)    Pulse 68   Temp 98.9 F (37.2 C) (Oral)   Resp 16   Ht 5\' 3"  (1.6 m)   Wt 124 lb 6.4 oz (56.4 kg)   SpO2 100%   BMI 22.04 kg/m   General Appearance:    Alert, cooperative, no distress, appears stated age  Head:    Normocephalic, without obvious abnormality, atraumatic  Eyes:    PERRL, conjunctiva/corneas clear, EOM's intact, fundi    benign, both eyes        Throat:   Lips, mucosa, and tongue normal; teeth and gums normal  Neck:   Supple, symmetrical, trachea midline, no adenopathy;    thyroid:  no enlargement/tenderness/nodules; no carotid   bruit or JVD  Back:     Symmetric, no curvature, ROM normal, no CVA tenderness  Lungs:     Clear to auscultation bilaterally, respirations unlabored  Chest Wall:    No tenderness or deformity   Heart:    Regular rate and rhythm, S1 and S2 normal, no murmur, rub or gallop appreciated at this time     Abdomen:     Soft, non-tender, bowel sounds active all four quadrants,    no masses, no organomegaly        Extremities:   Extremities normal, atraumatic, no cyanosis or edema  Pulses:   2+ and symmetric all extremities  Skin:   Skin color, texture, turgor normal, no rashes or lesions  Lymph nodes:   Cervical, supraclavicular, and axillary nodes normal  Neurologic:   CNII-XII intact, normal strength, sensation and reflexes    throughout    LABORATORY DATA:  Results for orders placed or performed in visit on 02/05/24 (from the past 48 hours)  CBC with Differential (Cancer Center Only)     Status: Abnormal   Collection Time: 02/05/24  8:40 AM  Result Value Ref Range   WBC Count 3.6 (L) 4.0 - 10.5 K/uL   RBC 4.39 3.87 - 5.11 MIL/uL   Hemoglobin 13.6 12.0 - 15.0 g/dL   HCT 36.6 44.0 - 34.7 %   MCV 90.9 80.0 - 100.0 fL   MCH 31.0 26.0 - 34.0 pg   MCHC 34.1 30.0 - 36.0 g/dL   RDW 42.5 95.6 - 38.7 %   Platelet Count 201 150 - 400 K/uL   nRBC 0.0 0.0 - 0.2 %   Neutrophils Relative % 42 %   Neutro Abs 1.5 (L) 1.7 - 7.7 K/uL    Lymphocytes Relative 43 %   Lymphs Abs 1.6 0.7 - 4.0 K/uL   Monocytes Relative 10 %   Monocytes Absolute 0.4 0.1 - 1.0 K/uL   Eosinophils Relative 4 %   Eosinophils Absolute 0.1 0.0 - 0.5 K/uL   Basophils Relative 1 %   Basophils Absolute 0.0 0.0 - 0.1 K/uL   Immature Granulocytes 0 %   Abs Immature Granulocytes 0.00 0.00 - 0.07 K/uL  Comment: Performed at [facility] at [facility], [address] [place], [place] [address]  CMP (Cancer Center only)     Status: Abnormal   Collection Time: 02/05/24  8:40 AM  Result Value Ref Range   Sodium 139 135 - 145 mmol/L   Potassium 4.2 3.5 - 5.1 mmol/L   Chloride 104 98 - 111 mmol/L   CO2 28 22 - 32 mmol/L   Glucose, Bld 113 (H) 70 - 99 mg/dL    Comment: Glucose reference range applies only to samples taken after fasting for at least 8 hours.   BUN 9 6 - 20 mg/dL   Creatinine 9.14 7.82 - 1.00 mg/dL   Calcium 9.3 8.9 - 95.6 mg/dL   Total Protein 6.9 6.5 - 8.1 g/dL   Albumin 4.4 3.5 - 5.0 g/dL   AST 15 15 - 41 U/L   ALT 8 0 - 44 U/L   Alkaline Phosphatase 37 (L) 38 - 126 U/L   Total Bilirubin 1.0 0.0 - 1.2 mg/dL   GFR, Estimated >21 >30 mL/min    Comment: (NOTE) Calculated using the CKD-EPI Creatinine Equation (2021)    Anion gap 7 5 - 15    Comment: Performed at [facility] at [facility], [address], [place], [place] [address]  Lactate dehydrogenase (LDH)     Status: None   Collection Time: 02/05/24  8:40 AM  Result Value Ref Range   LDH 118 98 - 192 U/L    Comment: Performed at [facility] Lab at [facility], [address], [place], [place] [address]  Save Smear for Provider Slide Review     Status: None   Collection Time: 02/05/24  8:40 AM  Result Value Ref Range   Smear Review SMEAR STAINED AND AVAILABLE FOR REVIEW     Comment: Performed at [facility] at [facility], [address], [place], [place] [address]       RADIOGRAPHY: No results found.     PATHOLOGY:   ASSESSMENT/PLAN: Ms. Malphrus is a very pleasant 38 yo African American female with history of intermittent benign familial neutropenia.  Patient has remained asymptomatic.  CBC with diff and blood smear were reviewed in detail with Dr. Myna Hidalgo.  No abnormality or evidence of malignancy noted.  No intervention or follow-up indicated.   All questions were answered. The patient knows to call the clinic with any problems, questions or concerns. We can certainly see the patient again for any heme/onc issues that arises in the future.   The patient was discussed with Dr. Myna Hidalgo and he is in agreement with the aforementioned.   Eileen Stanford, NP
